# Patient Record
Sex: Male | Born: 1935 | Race: Black or African American | Hispanic: No | Marital: Married | Smoking: Never smoker
Health system: Southern US, Community
[De-identification: ages and names within clinical notes are randomized; demographics above are authoritative.]

---

## 2016-11-04 ENCOUNTER — Inpatient Hospital Stay (HOSPITAL_COMMUNITY): Payer: Medicare Other | Admitting: Anesthesiology

## 2016-11-04 ENCOUNTER — Inpatient Hospital Stay (HOSPITAL_COMMUNITY)
Admission: EM | Admit: 2016-11-04 | Discharge: 2016-11-09 | DRG: 026 | Disposition: A | Discharge status: Hospice | Payer: Medicare Other | Attending: Internal Medicine | Admitting: Internal Medicine

## 2016-11-04 ENCOUNTER — Encounter (HOSPITAL_COMMUNITY)
Admission: EM | Disposition: A | Discharge status: Hospice | Payer: Self-pay | Source: Home / Self Care | Attending: Internal Medicine

## 2016-11-04 ENCOUNTER — Emergency Department (HOSPITAL_COMMUNITY): Payer: Medicare Other

## 2016-11-04 ENCOUNTER — Encounter (HOSPITAL_COMMUNITY): Payer: Self-pay

## 2016-11-04 DIAGNOSIS — S065X9A Traumatic subdural hemorrhage with loss of consciousness of unspecified duration, initial encounter: Principal | ICD-10-CM | POA: Diagnosis present

## 2016-11-04 DIAGNOSIS — N320 Bladder-neck obstruction: Secondary | ICD-10-CM | POA: Diagnosis present

## 2016-11-04 DIAGNOSIS — D631 Anemia in chronic kidney disease: Secondary | ICD-10-CM | POA: Diagnosis not present

## 2016-11-04 DIAGNOSIS — N132 Hydronephrosis with renal and ureteral calculous obstruction: Secondary | ICD-10-CM | POA: Diagnosis not present

## 2016-11-04 DIAGNOSIS — W19XXXA Unspecified fall, initial encounter: Secondary | ICD-10-CM | POA: Diagnosis present

## 2016-11-04 DIAGNOSIS — N390 Urinary tract infection, site not specified: Secondary | ICD-10-CM | POA: Diagnosis present

## 2016-11-04 DIAGNOSIS — E872 Acidosis, unspecified: Secondary | ICD-10-CM | POA: Diagnosis present

## 2016-11-04 DIAGNOSIS — I161 Hypertensive emergency: Secondary | ICD-10-CM | POA: Diagnosis not present

## 2016-11-04 DIAGNOSIS — Z8249 Family history of ischemic heart disease and other diseases of the circulatory system: Secondary | ICD-10-CM

## 2016-11-04 DIAGNOSIS — N400 Enlarged prostate without lower urinary tract symptoms: Secondary | ICD-10-CM | POA: Diagnosis present

## 2016-11-04 DIAGNOSIS — E440 Moderate protein-calorie malnutrition: Secondary | ICD-10-CM | POA: Diagnosis present

## 2016-11-04 DIAGNOSIS — D6489 Other specified anemias: Secondary | ICD-10-CM | POA: Diagnosis present

## 2016-11-04 DIAGNOSIS — N210 Calculus in bladder: Secondary | ICD-10-CM | POA: Diagnosis present

## 2016-11-04 DIAGNOSIS — N179 Acute kidney failure, unspecified: Secondary | ICD-10-CM | POA: Diagnosis present

## 2016-11-04 DIAGNOSIS — Z6821 Body mass index (BMI) 21.0-21.9, adult: Secondary | ICD-10-CM

## 2016-11-04 DIAGNOSIS — N184 Chronic kidney disease, stage 4 (severe): Secondary | ICD-10-CM | POA: Diagnosis present

## 2016-11-04 DIAGNOSIS — Y92009 Unspecified place in unspecified non-institutional (private) residence as the place of occurrence of the external cause: Secondary | ICD-10-CM | POA: Diagnosis not present

## 2016-11-04 DIAGNOSIS — I6200 Nontraumatic subdural hemorrhage, unspecified: Secondary | ICD-10-CM

## 2016-11-04 DIAGNOSIS — R410 Disorientation, unspecified: Secondary | ICD-10-CM | POA: Diagnosis not present

## 2016-11-04 DIAGNOSIS — J9690 Respiratory failure, unspecified, unspecified whether with hypoxia or hypercapnia: Secondary | ICD-10-CM

## 2016-11-04 DIAGNOSIS — N1330 Unspecified hydronephrosis: Secondary | ICD-10-CM | POA: Diagnosis present

## 2016-11-04 DIAGNOSIS — N189 Chronic kidney disease, unspecified: Secondary | ICD-10-CM

## 2016-11-04 DIAGNOSIS — I129 Hypertensive chronic kidney disease with stage 1 through stage 4 chronic kidney disease, or unspecified chronic kidney disease: Secondary | ICD-10-CM | POA: Diagnosis not present

## 2016-11-04 DIAGNOSIS — S065XAA Traumatic subdural hemorrhage with loss of consciousness status unknown, initial encounter: Secondary | ICD-10-CM | POA: Diagnosis present

## 2016-11-04 DIAGNOSIS — R1900 Intra-abdominal and pelvic swelling, mass and lump, unspecified site: Secondary | ICD-10-CM

## 2016-11-04 DIAGNOSIS — Z9889 Other specified postprocedural states: Secondary | ICD-10-CM

## 2016-11-04 HISTORY — PX: CRANIOTOMY: SHX93

## 2016-11-04 LAB — CBC WITH DIFFERENTIAL/PLATELET
Basophils Absolute: 0 10*3/uL (ref 0.0–0.1)
Basophils Relative: 0 %
Eosinophils Absolute: 0 10*3/uL (ref 0.0–0.7)
Eosinophils Relative: 0 %
HEMATOCRIT: 27.6 % — AB (ref 39.0–52.0)
HEMOGLOBIN: 9 g/dL — AB (ref 13.0–17.0)
LYMPHS ABS: 0.8 10*3/uL (ref 0.7–4.0)
LYMPHS PCT: 11 %
MCH: 29.2 pg (ref 26.0–34.0)
MCHC: 32.6 g/dL (ref 30.0–36.0)
MCV: 89.6 fL (ref 78.0–100.0)
Monocytes Absolute: 0.4 10*3/uL (ref 0.1–1.0)
Monocytes Relative: 5 %
NEUTROS PCT: 84 %
Neutro Abs: 6.3 10*3/uL (ref 1.7–7.7)
Platelets: 276 10*3/uL (ref 150–400)
RBC: 3.08 MIL/uL — AB (ref 4.22–5.81)
RDW: 14.1 % (ref 11.5–15.5)
WBC: 7.6 10*3/uL (ref 4.0–10.5)

## 2016-11-04 LAB — PROTIME-INR
INR: 1.19
Prothrombin Time: 15.2 seconds (ref 11.4–15.2)

## 2016-11-04 LAB — URINALYSIS, ROUTINE W REFLEX MICROSCOPIC
Bilirubin Urine: NEGATIVE
GLUCOSE, UA: NEGATIVE mg/dL
KETONES UR: NEGATIVE mg/dL
LEUKOCYTES UA: NEGATIVE
Nitrite: NEGATIVE
PROTEIN: 100 mg/dL — AB
Specific Gravity, Urine: 1.011 (ref 1.005–1.030)
pH: 7 (ref 5.0–8.0)

## 2016-11-04 LAB — URINE MICROSCOPIC-ADD ON

## 2016-11-04 LAB — COMPREHENSIVE METABOLIC PANEL
ALT: 12 U/L — ABNORMAL LOW (ref 17–63)
AST: 21 U/L (ref 15–41)
Albumin: 4.1 g/dL (ref 3.5–5.0)
Alkaline Phosphatase: 54 U/L (ref 38–126)
Anion gap: 11 (ref 5–15)
BILIRUBIN TOTAL: 0.6 mg/dL (ref 0.3–1.2)
BUN: 31 mg/dL — AB (ref 6–20)
CO2: 20 mmol/L — ABNORMAL LOW (ref 22–32)
Calcium: 9.1 mg/dL (ref 8.9–10.3)
Chloride: 110 mmol/L (ref 101–111)
Creatinine, Ser: 3.51 mg/dL — ABNORMAL HIGH (ref 0.61–1.24)
GFR, EST AFRICAN AMERICAN: 18 mL/min — AB (ref >60)
GFR, EST NON AFRICAN AMERICAN: 15 mL/min — AB (ref >60)
Glucose, Bld: 106 mg/dL — ABNORMAL HIGH (ref 65–99)
POTASSIUM: 4.4 mmol/L (ref 3.5–5.1)
Sodium: 141 mmol/L (ref 135–145)
TOTAL PROTEIN: 7.4 g/dL (ref 6.5–8.1)

## 2016-11-04 LAB — RAPID URINE DRUG SCREEN, HOSP PERFORMED
Amphetamines: NOT DETECTED
Barbiturates: NOT DETECTED
Benzodiazepines: NOT DETECTED
Cocaine: NOT DETECTED
OPIATES: NOT DETECTED
Tetrahydrocannabinol: NOT DETECTED

## 2016-11-04 LAB — APTT: aPTT: 27 seconds (ref 24–36)

## 2016-11-04 LAB — ETHANOL

## 2016-11-04 SURGERY — CRANIOTOMY HEMATOMA EVACUATION SUBDURAL
Anesthesia: General | Site: Head | Laterality: Left

## 2016-11-04 MED ORDER — NICARDIPINE HCL IN NACL 20-0.86 MG/200ML-% IV SOLN
3.0000 mg/h | Freq: Once | INTRAVENOUS | Status: DC
  Filled 2016-11-04: qty 200

## 2016-11-04 MED ORDER — FENTANYL CITRATE (PF) 100 MCG/2ML IJ SOLN
INTRAMUSCULAR | Status: DC | PRN
  Administered 2016-11-04: 200 ug via INTRAVENOUS

## 2016-11-04 MED ORDER — CEFTRIAXONE SODIUM 2 G IJ SOLR
2.0000 g | INTRAMUSCULAR | Status: DC
  Administered 2016-11-05 – 2016-11-08 (×4): 2 g via INTRAVENOUS
  Filled 2016-11-04 (×5): qty 2

## 2016-11-04 MED ORDER — SODIUM CHLORIDE 0.9 % IV SOLN
INTRAVENOUS | Status: DC
  Administered 2016-11-05 – 2016-11-06 (×3): via INTRAVENOUS

## 2016-11-04 MED ORDER — SUCCINYLCHOLINE CHLORIDE 20 MG/ML IJ SOLN
INTRAMUSCULAR | Status: DC | PRN
  Administered 2016-11-04: 140 mg via INTRAVENOUS

## 2016-11-04 MED ORDER — SODIUM CHLORIDE 0.9 % IV BOLUS (SEPSIS)
1000.0000 mL | Freq: Once | INTRAVENOUS | Status: AC
  Administered 2016-11-04: 1000 mL via INTRAVENOUS

## 2016-11-04 MED ORDER — FENTANYL CITRATE (PF) 100 MCG/2ML IJ SOLN
INTRAMUSCULAR | Status: AC
  Filled 2016-11-04: qty 2

## 2016-11-04 MED ORDER — PROPOFOL 10 MG/ML IV BOLUS
INTRAVENOUS | Status: DC | PRN
  Administered 2016-11-04: 100 mg via INTRAVENOUS
  Administered 2016-11-04: 25 mg via INTRAVENOUS

## 2016-11-04 MED ORDER — SUGAMMADEX SODIUM 200 MG/2ML IV SOLN
INTRAVENOUS | Status: AC
  Filled 2016-11-04: qty 2

## 2016-11-04 MED ORDER — CEFAZOLIN SODIUM-DEXTROSE 2-3 GM-% IV SOLR
INTRAVENOUS | Status: DC | PRN
  Administered 2016-11-04: 2 g via INTRAVENOUS

## 2016-11-04 MED ORDER — THROMBIN 20000 UNITS EX SOLR
CUTANEOUS | Status: AC
  Filled 2016-11-04: qty 20000

## 2016-11-04 MED ORDER — THROMBIN 20000 UNITS EX SOLR
CUTANEOUS | Status: DC | PRN
  Administered 2016-11-04: 20 mL via TOPICAL

## 2016-11-04 MED ORDER — NICARDIPINE HCL IN NACL 20-0.86 MG/200ML-% IV SOLN
3.0000 mg/h | Freq: Once | INTRAVENOUS | Status: AC
  Administered 2016-11-04: 5 mg/h via INTRAVENOUS
  Filled 2016-11-04: qty 200

## 2016-11-04 MED ORDER — LIDOCAINE HCL (PF) 0.5 % IJ SOLN
INTRAMUSCULAR | Status: AC
  Filled 2016-11-04: qty 50

## 2016-11-04 MED ORDER — ROCURONIUM BROMIDE 100 MG/10ML IV SOLN
INTRAVENOUS | Status: DC | PRN
  Administered 2016-11-04: 40 mg via INTRAVENOUS

## 2016-11-04 MED ORDER — ONDANSETRON HCL 4 MG/2ML IJ SOLN
INTRAMUSCULAR | Status: AC
  Filled 2016-11-04: qty 2

## 2016-11-04 MED ORDER — PHENYLEPHRINE HCL 10 MG/ML IJ SOLN
INTRAMUSCULAR | Status: DC | PRN
  Administered 2016-11-04: 25 ug/min via INTRAVENOUS

## 2016-11-04 MED ORDER — SODIUM CHLORIDE 0.9 % IV SOLN: 250.0000 mL | INTRAVENOUS | Status: DC | PRN

## 2016-11-04 MED ORDER — 0.9 % SODIUM CHLORIDE (POUR BTL) OPTIME
TOPICAL | Status: DC | PRN
  Administered 2016-11-04 (×2): 1000 mL

## 2016-11-04 MED ORDER — LIDOCAINE-EPINEPHRINE 0.5 %-1:200000 IJ SOLN
INTRAMUSCULAR | Status: DC | PRN
  Administered 2016-11-04: 6 mL via INTRADERMAL

## 2016-11-04 MED ORDER — SODIUM CHLORIDE 0.9 % IV SOLN
INTRAVENOUS | Status: DC | PRN
  Administered 2016-11-04 – 2016-11-05 (×2): via INTRAVENOUS

## 2016-11-04 MED ORDER — FAMOTIDINE IN NACL 20-0.9 MG/50ML-% IV SOLN: 20.0000 mg | Freq: Two times a day (BID) | INTRAVENOUS | Status: DC

## 2016-11-04 MED ORDER — PROPOFOL 10 MG/ML IV BOLUS
INTRAVENOUS | Status: AC
  Filled 2016-11-04: qty 20

## 2016-11-04 MED ORDER — LIDOCAINE HCL (CARDIAC) 20 MG/ML IV SOLN
INTRAVENOUS | Status: DC | PRN
  Administered 2016-11-04: 100 mg via INTRATRACHEAL

## 2016-11-04 MED ORDER — MICROFIBRILLAR COLL HEMOSTAT EX PADS
MEDICATED_PAD | CUTANEOUS | Status: DC | PRN
  Administered 2016-11-04: 1 via TOPICAL

## 2016-11-04 SURGICAL SUPPLY — 71 items
BANDAGE GAUZE 4  KLING STR (GAUZE/BANDAGES/DRESSINGS) ×3 IMPLANT
BENZOIN TINCTURE PRP APPL 2/3 (GAUZE/BANDAGES/DRESSINGS) IMPLANT
BLADE CLIPPER SURG (BLADE) ×3 IMPLANT
BLADE ULTRA TIP 2M (BLADE) ×3 IMPLANT
BNDG GAUZE ELAST 4 BULKY (GAUZE/BANDAGES/DRESSINGS) ×6 IMPLANT
BUR ACORN 6.0 PRECISION (BURR) ×2 IMPLANT
BUR ACORN 6.0MM PRECISION (BURR) ×1
BUR MATCHSTICK NEURO 3.0 LAGG (BURR) IMPLANT
BUR SPIRAL ROUTER 2.3 (BUR) IMPLANT
BUR SPIRAL ROUTER 2.3MM (BUR)
CANISTER SUCT 3000ML PPV (MISCELLANEOUS) ×3 IMPLANT
CLIP TI MEDIUM 6 (CLIP) IMPLANT
DRAPE NEUROLOGICAL W/INCISE (DRAPES) ×3 IMPLANT
DRAPE SURG 17X23 STRL (DRAPES) IMPLANT
DRAPE WARM FLUID 44X44 (DRAPE) ×3 IMPLANT
DURAPREP 6ML APPLICATOR 50/CS (WOUND CARE) ×3 IMPLANT
ELECT CAUTERY BLADE 6.4 (BLADE) ×3 IMPLANT
ELECT REM PT RETURN 9FT ADLT (ELECTROSURGICAL) ×3
ELECTRODE REM PT RTRN 9FT ADLT (ELECTROSURGICAL) ×1 IMPLANT
EVACUATOR 1/8 PVC DRAIN (DRAIN) IMPLANT
EVACUATOR SILICONE 100CC (DRAIN) IMPLANT
GAUZE SPONGE 4X4 12PLY STRL (GAUZE/BANDAGES/DRESSINGS) ×3 IMPLANT
GAUZE SPONGE 4X4 16PLY XRAY LF (GAUZE/BANDAGES/DRESSINGS) IMPLANT
GLOVE BIO SURGEON STRL SZ 6.5 (GLOVE) ×2 IMPLANT
GLOVE BIO SURGEON STRL SZ7 (GLOVE) ×3 IMPLANT
GLOVE BIO SURGEONS STRL SZ 6.5 (GLOVE) ×1
GLOVE BIOGEL PI IND STRL 6.5 (GLOVE) ×1 IMPLANT
GLOVE BIOGEL PI IND STRL 7.5 (GLOVE) ×1 IMPLANT
GLOVE BIOGEL PI INDICATOR 6.5 (GLOVE) ×2
GLOVE BIOGEL PI INDICATOR 7.5 (GLOVE) ×2
GLOVE ECLIPSE 6.5 STRL STRAW (GLOVE) ×3 IMPLANT
GLOVE EXAM NITRILE LRG STRL (GLOVE) IMPLANT
GLOVE EXAM NITRILE XL STR (GLOVE) IMPLANT
GLOVE EXAM NITRILE XS STR PU (GLOVE) IMPLANT
GOWN STRL REUS W/ TWL LRG LVL3 (GOWN DISPOSABLE) ×3 IMPLANT
GOWN STRL REUS W/ TWL XL LVL3 (GOWN DISPOSABLE) IMPLANT
GOWN STRL REUS W/TWL 2XL LVL3 (GOWN DISPOSABLE) IMPLANT
GOWN STRL REUS W/TWL LRG LVL3 (GOWN DISPOSABLE) ×6
GOWN STRL REUS W/TWL XL LVL3 (GOWN DISPOSABLE)
GRAFT DURAGEN MATRIX 1WX1L (Tissue) ×3 IMPLANT
HEMOSTAT SURGICEL 2X14 (HEMOSTASIS) IMPLANT
KIT BASIN OR (CUSTOM PROCEDURE TRAY) ×3 IMPLANT
KIT ROOM TURNOVER OR (KITS) ×3 IMPLANT
NEEDLE HYPO 25X1 1.5 SAFETY (NEEDLE) ×3 IMPLANT
NS IRRIG 1000ML POUR BTL (IV SOLUTION) ×3 IMPLANT
PACK CRANIOTOMY (CUSTOM PROCEDURE TRAY) ×3 IMPLANT
PATTIES SURGICAL .5 X.5 (GAUZE/BANDAGES/DRESSINGS) IMPLANT
PATTIES SURGICAL .5 X3 (DISPOSABLE) IMPLANT
PATTIES SURGICAL 1X1 (DISPOSABLE) IMPLANT
PLATE 1.5  2HOLE MED NEURO (Plate) ×4 IMPLANT
PLATE 1.5 2HOLE MED NEURO (Plate) ×2 IMPLANT
PLATE 1.5 5HOLE SQUARE (Plate) ×9 IMPLANT
SCREW SELF DRILL HT 1.5/4MM (Screw) ×42 IMPLANT
SPONGE NEURO XRAY DETECT 1X3 (DISPOSABLE) IMPLANT
SPONGE SURGIFOAM ABS GEL 100 (HEMOSTASIS) ×3 IMPLANT
STAPLER VISISTAT 35W (STAPLE) ×3 IMPLANT
SUT ETHILON 3 0 FSL (SUTURE) IMPLANT
SUT ETHILON 3 0 PS 1 (SUTURE) IMPLANT
SUT NURALON 4 0 TR CR/8 (SUTURE) ×9 IMPLANT
SUT STEEL 0 (SUTURE)
SUT STEEL 0 18XMFL TIE 17 (SUTURE) IMPLANT
SUT VIC AB 2-0 CT2 18 VCP726D (SUTURE) ×6 IMPLANT
SYR CONTROL 10ML LL (SYRINGE) ×3 IMPLANT
TAPE CLOTH 1X10 TAN NS (GAUZE/BANDAGES/DRESSINGS) ×3 IMPLANT
TOWEL OR 17X24 6PK STRL BLUE (TOWEL DISPOSABLE) ×3 IMPLANT
TOWEL OR 17X26 10 PK STRL BLUE (TOWEL DISPOSABLE) ×3 IMPLANT
TRAY FOLEY W/METER SILVER 16FR (SET/KITS/TRAYS/PACK) ×3 IMPLANT
TUBE CONNECTING 12'X1/4 (SUCTIONS) ×1
TUBE CONNECTING 12X1/4 (SUCTIONS) ×2 IMPLANT
UNDERPAD 30X30 (UNDERPADS AND DIAPERS) ×3 IMPLANT
WATER STERILE IRR 1000ML POUR (IV SOLUTION) ×3 IMPLANT

## 2016-11-04 NOTE — ED Notes (Signed)
Patient transported to X-ray 

## 2016-11-04 NOTE — ED Provider Notes (Signed)
[facility] Provider Note   CSN: [id number] Arrival date & time: 11/04/16  1420     History   Chief Complaint Chief Complaint  Patient presents with  . Altered Mental Status    level V caveat due to altered mental status. HPI Jeremy CanavanBobby Greenhouse is a 80 y.o. male.  HPI Patient presents with altered mental status. Initial report was that he has baseline confusion but after further discussion the family he is normally appropriate. Now he is somewhat confused. Cannot tell if the date. Can't tell him the names. Cannot tell what exactly was going on or how he ended up in the ER.denies headache. Denies abdominal pain. Reportedly has had a couple weeks ago. Patient states he is retired. History reviewed. No pertinent past medical history.  There are no active problems to display for this patient.   History reviewed. No pertinent surgical history.     Home Medications    Prior to Admission medications   Medication Sig Start Date End Date Taking? Authorizing Provider  Ascorbic Acid (VITAMIN C PO) Take 1 tablet by mouth daily.   Yes Historical Provider, MD  Multiple Vitamins-Minerals (ONE-A-DAY MENS 50+ ADVANTAGE PO) Take 1 tablet by mouth daily.   Yes Historical Provider, MD  POTASSIUM PO Take 1 tablet by mouth daily.   Yes Historical Provider, MD  Pyridoxine HCl (VITAMIN B-6 PO) Take 1 tablet by mouth daily.   Yes Historical Provider, MD    Family History History reviewed. No pertinent family history.  Social History Social History  Substance Use Topics  . Smoking status: Never Smoker  . Smokeless tobacco: Never Used  . Alcohol use No     Allergies   Patient has no known allergies.   Review of Systems Review of Systems  Unable to perform ROS: Mental status change     Physical Exam Updated Vital Signs BP 181/94 (BP Location: Right Arm)   Pulse 61   Temp 97.9 F (36.6 C) (Rectal)   Resp 16   Physical Exam  Constitutional: He appears well-developed.    HENT:  Head: Normocephalic.  Eyes: Pupils are equal, round, and reactive to light. No scleral icterus.  Neck: Neck supple.  Cardiovascular: Normal rate.   Pulmonary/Chest: Effort normal.  Abdominal: He exhibits mass.  Moderate size abdominal mass. Seems to be ordered originating from the left lower quadrant and proceeds up past the umbilicus. No hernias palpated. Per family members this is been there recently and was not chronic.  Musculoskeletal: He exhibits no edema.  Neurological: He is alert.  Patient is awake and oriented to self but somewhat confused otherwise.  Skin: There is pallor.  Psychiatric: He has a normal mood and affect.     ED Treatments / Results  Labs (all labs ordered are listed, but only abnormal results are displayed) Labs Reviewed  COMPREHENSIVE METABOLIC PANEL  ETHANOL  URINALYSIS, ROUTINE W REFLEX MICROSCOPIC (NOT [facility])  RAPID URINE DRUG SCREEN, HOSP PERFORMED  CBC WITH DIFFERENTIAL/PLATELET    EKG  EKG Interpretation None       Radiology No results found.  Procedures Procedures (including critical care time)  Medications Ordered in ED Medications - No data to display   Initial Impression / Assessment and Plan / ED Course  I have reviewed the triage vital signs and the nursing notes.  Pertinent labs & imaging results that were available during my care of the patient were reviewed by me and considered in my medical decision making (see chart  for details).  Clinical Course      Reportedly has had it for the last 2-3 days. Labs pending. Will get head CT abdominal pelvis CT for mass and chest x-ray. Will also get workup for infection Care turned over to Dr. Erma HeritageIsaacs. Family is worried he is normally  Final Clinical Impressions(s) / ED Diagnoses   Final diagnoses:  None    New Prescriptions New Prescriptions   No medications on file     Benjiman CoreNathan Amillion [place], MD 11/04/16 1516

## 2016-11-04 NOTE — Anesthesia Preprocedure Evaluation (Addendum)
Anesthesia Evaluation  Patient identified by MRN, date of birth, ID bandGeneral Assessment Comment:Patient somnolent   Airway Mallampati: II       Dental  (+) Dental Advisory Given,    Pulmonary    Pulmonary exam normal        Cardiovascular Normal cardiovascular exam     Neuro/Psych    GI/Hepatic   Endo/Other    Renal/GU      Musculoskeletal   Abdominal   Peds  Hematology   Anesthesia Other Findings   Reproductive/Obstetrics                            Anesthesia Physical Anesthesia Plan  ASA: III and emergent  Anesthesia Plan:    Post-op Pain Management:    Induction: Intravenous, Rapid sequence and Cricoid pressure planned  Airway Management Planned: Oral ETT  Additional Equipment: Arterial line  Intra-op Plan:   Post-operative Plan: Possible Post-op intubation/ventilation  Informed Consent:   Plan Discussed with: CRNA, Anesthesiologist and Surgeon  Anesthesia Plan Comments:         Anesthesia Quick Evaluation

## 2016-11-04 NOTE — H&P (Signed)
PULMONARY / CRITICAL CARE MEDICINE   Name: Jeremy Roach MRN: [id number] DOB: 07/26/1936    ADMISSION DATE:  11/04/2016 CONSULTATION DATE:  11/04/2016  REFERRING MD:  Dr. Franky Machoabbell  CHIEF COMPLAINT:  Lethargy  HISTORY OF PRESENT ILLNESS:   80 year old male with no significant past medical history, albeit, he has not seen a doctor for several years. His wife is a Engineer, civil (consulting)nurse and regularly checks his BP and CBG which are usually fine. He fell at home about 2 weeks PTA and hit his head. He did tell his daughter about this, but not his wife. 11/7 he noticed some fatigue, was otherwise OK. The progressively worsened until 11/9 when he was lethargic. He has also had urinary frequency, but no dysuria, weak stream, or any other complaint. Wife called EMS. 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 Neurosurgery planning for OR. Also noted to have abdominal mass on exam. Sent for CT abdomen/pelvis which demonstrated bilateral hydroureteronephrosis and bladder distention. Bladder calcification noted possibly at the origin of the urethra. PCCM asked to admit.  Wife notes unintentional weight loss of 30 pounds over the last year.  PAST MEDICAL HISTORY :  He  has no past medical history on file. Has not seen a doctor.   PAST SURGICAL HISTORY: He  has no past surgical history on file.  Nasal surgery > ? 20 yrs ago, related to trauma.   No Known Allergies  No current facility-administered medications on file prior to encounter.    No current outpatient prescriptions on file prior to encounter.    FAMILY HISTORY:  His has no family status information on file.  No information on his father. Mother is deceased and she had heart dse.   SOCIAL HISTORY: He  reports that he has never smoked. He has never used smokeless tobacco. He reports that he does not drink alcohol or use drugs.  He is married, has 4 children, was  from [place] originally, has been in [place] < 20 yrs now.  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   REVIEW OF SYSTEMS:   Limited due to lethargy and encephalopathy Bolds are positive  Constitutional: weight loss, gain, night sweats, Fevers, chills, fatigue .  HEENT: headaches, Sore throat, sneezing, nasal congestion, post nasal drip, Difficulty swallowing, Tooth/dental problems, visual complaints visual changes, ear ache CV:  chest pain, radiates: ,Orthopnea, PND, swelling in lower extremities, dizziness, palpitations, syncope.  GI  heartburn, indigestion, abdominal pain, nausea, vomiting, diarrhea, change in bowel habits, loss of appetite, bloody stools.  Resp: cough, productive: , hemoptysis, dyspnea, chest pain, pleuritic.  Skin: rash or itching or icterus GU: dysuria, change in color of urine, urgency or frequency. flank pain, hematuria  MS: joint pain or swelling. decreased range of motion  Psych: change in mood or affect. depression or anxiety.  Neuro: difficulty with speech, weakness, numbness, ataxia    SUBJECTIVE:  Comfortable. Lethargic.   VITAL SIGNS: BP 195/88   Pulse (!) 58   Temp 97.9 F (36.6 C) (Rectal)   Resp 14   SpO2 100%   HEMODYNAMICS:    VENTILATOR SETTINGS:    INTAKE / OUTPUT: I/O last 3 completed shifts: In: -  Out: 400 [Urine:400]  PHYSICAL EXAMINATION: General:  Elderly male, thin, NAD Neuro:  Alert to voice. Oriented to self.  HEENT:  Normocephalic, small old abrasion to L side temporal face. Cardiovascular:  RRR, no MRG Lungs:  Clear, bilateral  breath sounds Abdomen:  BS normal, soft, non-tender. Musculoskeletal:  No acute deformity or ROM limitation Skin:  Grossly intact  LABS:  BMET  Recent Labs Lab 11/04/16 1600  NA 141  K 4.4  CL 110  CO2 20*  BUN 31*  CREATININE 3.51*  GLUCOSE 106*    Electrolytes  Recent Labs Lab 11/04/16 1600  CALCIUM 9.1    CBC  Recent Labs Lab 11/04/16 1600  WBC 7.6  HGB 9.0*  HCT 27.6*  PLT 276     Coag's  Recent Labs Lab 11/04/16 1945  APTT 27  INR 1.19    Sepsis Markers No results for input(s): LATICACIDVEN, PROCALCITON, O2SATVEN in the last 168 hours.  ABG No results for input(s): PHART, PCO2ART, PO2ART in the last 168 hours.  Liver Enzymes  Recent Labs Lab 11/04/16 1600  AST 21  ALT 12*  ALKPHOS 54  BILITOT 0.6  ALBUMIN 4.1    Cardiac Enzymes No results for input(s): TROPONINI, PROBNP in the last 168 hours.  Glucose No results for input(s): GLUCAP in the last 168 hours.  Imaging Ct Abdomen Pelvis Wo Contrast  Result Date: 11/04/2016 CLINICAL DATA:  Pain after fall. Abdominal mass. Altered mental status. EXAM: CT ABDOMEN AND PELVIS WITHOUT CONTRAST TECHNIQUE: Multidetector CT imaging of the abdomen and pelvis was performed following the standard protocol without IV contrast. COMPARISON:  None. FINDINGS: Lower chest: The heart is enlarged. Hyperdense appearance of the cardiac walls and septum may be secondary to anemia. Faint calcifications noted in the left ventricle, possibly related to the mitral valve. No pericardial effusion. There is streaky atelectasis and/or scarring at each lung base left greater than right. No pneumonic consolidations. Tiny nodular density in the right lower lobe laterally consistent with a branch point for pulmonary vessel. Hepatobiliary: Nonspecific hypodensities in the left hepatic lobe the largest is 15 mm seen on series 201, image 13 with Hounsfield unit of 14. Findings are statistically consistent with cysts and/or hemangiomata. The lack of IV contrast limits further assessment. No biliary dilatation is noted. Gallbladder appears physiologically distended without calculus. Pancreas: The pancreas is unremarkable for this unenhanced study. No ductal dilatation is apparent. Spleen: There is no splenomegaly. Adrenals/Urinary Tract: Marked bilateral hydroureteronephrosis with marked distention of the bladder to 22.3 cm craniocaudad by 13  cm AP by 15.2 cm transverse. There is a calcification along the floor of the bladder measuring 12 by 8 by 5 mm possibly at the origin of the prostatic urethra. This may be causing obstruction. Foley catheter decompression is recommended. Neither adrenal gland is well visualized due to lack of oral and IV contrast. Stomach/Bowel: No bowel obstruction or definite inflammation. Stomach is not distended. Moderate amount of stool in the right colon and rectum. Vascular/Lymphatic: Aortoiliac atherosclerosis without aneurysm. No lymphadenopathy. Numerous pelvic phleboliths are seen bilaterally in the lower pelvis. Reproductive: Enlarged prostate with peripheral and central zone calcifications measuring up to 7 cm. Other: No bowel herniation. Trace fluid along the paracolic gutters. Musculoskeletal: No acute osseous abnormality. There is degenerative disc disease at L3-4, L4-5 and L5-S1 with associated mild-to-moderate neural foraminal encroachment at L4-5. IMPRESSION: Marked bilateral hydroureteronephrosis and bladder distention. A 12 x 8 x 5 mm bladder calcification is seen possibly at the origin of the prostatic urethra which may explain the marked bladder (volume of 4.4 liters) O and renal collecting system obstruction described. Foley catheter to drainage is recommended. Electronically Signed   By: Tollie Eth M.D.   On: 11/04/2016 18:36   Dg  Chest 2 View  Result Date: 11/04/2016 CLINICAL DATA:  Altered mental status EXAM: CHEST  2 VIEW COMPARISON:  None. FINDINGS: There is no focal parenchymal opacity. There is no pleural effusion or pneumothorax. There is stable cardiomegaly. The osseous structures are unremarkable. IMPRESSION: No active cardiopulmonary disease. Electronically Signed   By: Elige Ko   On: 11/04/2016 15:19   Ct Head Wo Contrast  Result Date: 11/04/2016 CLINICAL DATA:  Altered mental status with history of fall EXAM: CT HEAD WITHOUT CONTRAST TECHNIQUE: Contiguous axial images were obtained  from the base of the skull through the vertex without intravenous contrast. COMPARISON:  None. 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 This is larger on the left side, and measures at least 2.5 cm in thickness. There is approximately 12 mm of midline shift to the right. Right-sided subdural hematoma measures 1.2 cm in maximum thickness along the right parietal convexity on coronal views. There is inter hemispheric extension of subdural blood anteriorly and posteriorly, right inter hemispheric subdural hematoma measures 8 mm in thickness. There is additional subdural blood along the right tentorium. There is compression and mass effect upon the lateral ventricles with asymmetric dilatation of the right atrium. Fourth ventricle is small but patent. There is mild effacement of the pre pontine and suprasellar cisterns. Diffuse bilateral brain swelling. There is no transtentorial herniation. Sagittal views demonstrate a possible 4 mm hypodense nodule within the posterior aspect of the pituitary gland. There is no focal mass. Vascular: No hyperdense vessels.  Carotid artery calcifications. Skull: No obvious fracture.  The mastoid air cells are clear. Sinuses/Orbits: Minimal mucosal thickening in the ethmoid sinuses. The globes appear intact. Other: None IMPRESSION: Large bilateral left greater than right acute on chronic subdural hematoma as with approximately 12 mm of midline shift to the right. There is inter hemispheric subdural hematoma as well as right tentorial subdural. There is significant mass effect on the lateral ventricles. Fourth ventricle is patent. There is mild effacement of the basilar cisterns with diffuse brain swelling present. Possible 4 mm hypodense nodule in the posterior aspect of the pituitary gland. Eventual MRI evaluation may be considered Critical Value/emergent results were called by telephone at the time of interpretation on 11/04/2016 at 6:37 pm  to Dr. Erma Heritage , who verbally acknowledged these results. Electronically Signed   By: Jasmine Pang M.D.   On: 11/04/2016 18:37     STUDIES:  CT head 11/9 > Large bilateral left greater than right acute on chronic subdural hematoma as with approximately 12 mm of midline shift to the right. There is inter hemispheric subdural hematoma as well as right tentorial subdural. There is significant mass effect on the lateral ventricles. Fourth ventricle is patent. There is mild effacement of the basilar cisterns with diffuse brain swelling present. Possible 4 mm hypodense nodule in the posterior aspect of the pituitary gland. Eventual MRI evaluation may be considered. CT abdomen 11/9 > Marked bilateral hydroureteronephrosis and bladder distention. A 12 x 8 x 5 mm bladder calcification is seen possibly at the origin of the prostatic urethra which may explain the marked bladder (volume of 4.4 liters) O and renal collecting system obstruction described. Foley catheter to drainage is recommended.  CULTURES: Urine Cx 11/9 >  ANTIBIOTICS: Ceftriaxone 11/9 >  SIGNIFICANT EVENTS: 11/9 admit, to OR for SDH evac  LINES/TUBES:   DISCUSSION: 80 year old male admitted 11/9 with SDH after fall 2 weeks ago. Also with bladder outlet obstruction and bilateral hydro.  ASSESSMENT / PLAN:  PULMONARY A: May need mechanical vent post-op  P:   Will follow up after surgery  CARDIOVASCULAR A:  Hypertensive urgency  P:  Telemetry monitoring SBP goals 25% reduction for first 24 hours. Keep 150-170.  Nicardipine for SBP goal  RENAL A:   Presumably Acute renal failiure, post renal in setting bladder outlet obstruction Bilateral hydronephrosis  P:   Foley - draining bloody appearing urine Follow BMP Gentle hydration If does not resolve with foley drainage may need IR to place drains.  GASTROINTESTINAL A:   No acute issues  P:   NPO Pepcid  HEMATOLOGIC A:   Anemia, unknown acuity  P:   SCDs Follow CBC  INFECTIOUS A:   Empiric tx in setting hydro, urinary stasis  P:   CTX per pharmacy Follow urin cx PCT Likely can deescalate quickly  ENDOCRINE A:   No acute issues  P:     NEUROLOGIC A:   bilateral L > R subdural hematoma P:   To OR under Dr. Franky Macho tonight Will follow up post-op Neuro checks Limit sedatives.   FAMILY  - Updates: Wife updated by AD in ED  - Inter-disciplinary family meet or Palliative Care meeting due by:  11/16    Joneen Roach, AGACNP-BC [place] [facility] Pager [phone] or [phone]  11/04/2016 10:49 PM   ATTENDING NOTE / ATTESTATION NOTE :   I have discussed the case with the resident/APP  Joneen Roach.   I agree with the resident/APP's  history, physical examination, assessment, and plans.    I have edited the above note and modified it according to our agreed history, physical examination, assessment and plan.   Patient admitted for worsening confusion and lethargy. Patient does not have any past medical history as he does not see a doctor on a regular basis. He had a fall 1-2 weeks ago. Wife noticed some confusion less than a week ago which slowly worsened. He was sleepy almost all day today which prompted wife to call [phone]. Cranial CT scan showed acute on chronic subdural hematoma, bilateral, with 12 mm midline shift to the right. Neurosurgery was consulted and the plan is for him to go to surgery tonight.  Patient was also found to be hypertensive (180-200/80-90) for which she was placed on Cardene drip. Wife and patient deny any urinary problems.  CT scan of the abdomen showed marked bilateral Hydro ureteronephrosis and bladder distention. There was a 12 x 8 x 5 mm latter calcification at the origin of the prostatic urethra, likely explaining hydronephrosis. Foley catheter has since been inserted with note of hematuria.  Physical exam as above. Patient seen, drowsy, denies any subjective complaint,  comfortable. Blood pressure was 180/90, heart rate 60, respiratory rate 20, 100% O2 saturation on 2 L nasal cannula. Cranial nerves grossly intact. No lateralizing signs elicited. No neck vein distention. Chest exam revealed good air entry bilaterally. Clear to auscultation. Cardiac exam showed good S1 and S2. No S3 murmur rub or gallop. Abdomen such as possible bowel sounds. Soft. Nontender. Extremities revealed negative edema clubbing cyanosis.  Labs reviewed. Ct scan of head and abdomen as described above. Creatinine elevated at 3.5. Bicarbonate 20. BUN 31. WBC was 8. Platelet was 276. INR 1.19. No acute changes in chest x-ray.  Assessment : 1. Acute on chronic bilateral subdural hematoma. Likely related to fall. 12 mm midline shift to the Right. 2. AKI 2/2 B hydroureteronephrosis 2/2 bladder outlet obstruction 3. HTN, not sure if related  to #1 with herniation. Likely chronic HTN 4. Possible UTI  Plan : 1. Pt is at OR now for evacuation of subdural hematoma.  2. Cont cardene drip. Will defer to neurosurgery what MAP goal or SBP goal will be. I anticipate, blood pressure will drop with anesthesia and sedation. 3. Continue Foley catheter for bladder outlet obstruction. Send urine for culture. Continue Rocephin for now for possible UTI. If cultures are negative, may discontinue antibiotics. Will need urology consultation later on.  4. Anticipate, he will be on the ventilator postoperatively. Weaning will be contingent on how outcome of surgery will be. 5. Cont IVF. 6. If on the ventilator, will need sedation (Propofol) post op.  7. Keep NPO.  8. SCDs.   I have spent 30  minutes of critical care time with this patient today.  Family :Family updated at length today.  I extensively discussed the plan of care with the patient's wife.   Pollie MeyerJ. Angelo A de Dios, MD 11/04/2016, 10:49 PM [facility] Pager [phone] After 3 pm or if no answer, call  [phone]

## 2016-11-04 NOTE — ED Provider Notes (Signed)
Assumed care from Dr. Rubin PayorPickering at 3 PM. Briefly, the patient is a 80 year old male with no significant past medical history here with acute altered mental status. Patient normally alert and oriented at baseline. Patient noted to have abdominal mass on exam. Nonfocal neurological exam. Plan of follow-up labs, imaging, and reassess..   Labs Reviewed  COMPREHENSIVE METABOLIC PANEL - Abnormal; Notable for the following:       Result Value   CO2 20 (*)    Glucose, Bld 106 (*)    BUN 31 (*)    Creatinine, Ser 3.51 (*)    ALT 12 (*)    GFR calc non Af Amer 15 (*)    GFR calc Af Amer 18 (*)    All other components within normal limits  URINALYSIS, ROUTINE W REFLEX MICROSCOPIC (NOT [facility]) - Abnormal; Notable for the following:    Color, Urine RED (*)    APPearance TURBID (*)    Hgb urine dipstick LARGE (*)    Protein, ur 100 (*)    All other components within normal limits  CBC WITH DIFFERENTIAL/PLATELET - Abnormal; Notable for the following:    RBC 3.08 (*)    Hemoglobin 9.0 (*)    HCT 27.6 (*)    All other components within normal limits  URINE MICROSCOPIC-ADD ON - Abnormal; Notable for the following:    Squamous Epithelial / LPF 0-5 (*)    Bacteria, UA RARE (*)    All other components within normal limits  ETHANOL  RAPID URINE DRUG SCREEN, HOSP PERFORMED  PROTIME-INR  APTT  CBC  BASIC METABOLIC PANEL  MAGNESIUM  PHOSPHORUS    Course of Care: -Labs and imaging reviewed as above. CT head shows large, acute on chronic subdurals bilaterally, with midline shift. I immediately discussed with neurosurgery, who will take the patient to the OR for drainage. Otherwise, CT abdomen pelvis shows large, markedly distended bladder with bilateral hydronephrosis and lab work shows acute kidney injury. Suspect postobstructive AK I, possibly secondary to retained stone, versus prostatomegaly, versus possible prostate cancer. Foley placed with drainage of large amount of urine. Will admit to the ICU  following operative drainage.  CRITICAL CARE Performed by: Dollene Clevelandameron Isascs Total critical care time: 45 minutes  Critical care time was exclusive of separately billable procedures and treating other patients.  Critical care was necessary to treat or prevent imminent or life-threatening deterioration.  Critical care was time spent personally by me on the following activities: development of treatment plan with patient and/or surrogate as well as nursing, discussions with consultants, evaluation of patient's response to treatment, examination of patient, obtaining history from patient or surrogate, ordering and performing treatments and interventions, ordering and review of laboratory studies, ordering and review of radiographic studies, pulse oximetry and re-evaluation of patient's condition.      Shaune Pollackameron Kiyo Heal, MD 11/05/16 [phone]

## 2016-11-04 NOTE — Anesthesia Procedure Notes (Signed)
Procedure Name: Intubation Date/Time: 11/04/2016 10:40 PM Performed by: Molli HazardGORDON, Lilyian Quayle M Pre-anesthesia Checklist: Patient identified, Emergency Drugs available, Suction available and Patient being monitored Patient Re-evaluated:Patient Re-evaluated prior to inductionPreoxygenation: Pre-oxygenation with 100% oxygen Intubation Type: IV induction, Rapid sequence and Cricoid Pressure applied Laryngoscope Size: Miller and 2 Grade View: Grade II Tube type: Subglottic suction tube Tube size: 7.5 mm Number of attempts: 1 Airway Equipment and Method: Stylet Placement Confirmation: ETT inserted through vocal cords under direct vision,  positive ETCO2 and breath sounds checked- equal and bilateral Secured at: 23 cm Tube secured with: Tape Dental Injury: Teeth and Oropharynx as per pre-operative assessment

## 2016-11-04 NOTE — Consult Note (Signed)
Reason for Consult:Subdural hematoma, altered mental status Referring Physician: ED  Jeremy Roach is an 80 y.o. male.  HPI: whom was in his usual state of health until this past week. He became weak, was not acting normally, was confused, not oriented to place, time, situation. Head CT revealed a large left sided mixed density subdural hematoma causing mass effect.   History reviewed. No pertinent past medical history.  History reviewed. No pertinent surgical history.  History reviewed. No pertinent family history.  Social History:  reports that he has never smoked. He has never used smokeless tobacco. He reports that he does not drink alcohol or use drugs.  Allergies: No Known Allergies  Medications: I have reviewed the patient's current medications.  Results for orders placed or performed during the hospital encounter of 11/04/16 (from the past 48 hour(s))  Comprehensive metabolic panel     Status: Abnormal   Collection Time: 11/04/16  4:00 PM  Result Value Ref Range   Sodium 141 135 - 145 mmol/L   Potassium 4.4 3.5 - 5.1 mmol/L   Chloride 110 101 - 111 mmol/L   CO2 20 (L) 22 - 32 mmol/L   Glucose, Bld 106 (H) 65 - 99 mg/dL   BUN 31 (H) 6 - 20 mg/dL   Creatinine, Ser 3.51 (H) 0.61 - 1.24 mg/dL   Calcium 9.1 8.9 - 10.3 mg/dL   Total Protein 7.4 6.5 - 8.1 g/dL   Albumin 4.1 3.5 - 5.0 g/dL   AST 21 15 - 41 U/L   ALT 12 (L) 17 - 63 U/L   Alkaline Phosphatase 54 38 - 126 U/L   Total Bilirubin 0.6 0.3 - 1.2 mg/dL   GFR calc non Af Amer 15 (L) >60 mL/min   GFR calc Af Amer 18 (L) >60 mL/min    Comment: (NOTE) The eGFR has been calculated using the CKD EPI equation. This calculation has not been validated in all clinical situations. eGFR's persistently <60 mL/min signify possible Chronic Kidney Disease.    Anion gap 11 5 - 15  CBC with Differential     Status: Abnormal   Collection Time: 11/04/16  4:00 PM  Result Value Ref Range   WBC 7.6 4.0 - 10.5 K/uL   RBC 3.08 (L) 4.22  - 5.81 MIL/uL   Hemoglobin 9.0 (L) 13.0 - 17.0 g/dL   HCT 27.6 (L) 39.0 - 52.0 %   MCV 89.6 78.0 - 100.0 fL   MCH 29.2 26.0 - 34.0 pg   MCHC 32.6 30.0 - 36.0 g/dL   RDW 14.1 11.5 - 15.5 %   Platelets 276 150 - 400 K/uL   Neutrophils Relative % 84 %   Neutro Abs 6.3 1.7 - 7.7 K/uL   Lymphocytes Relative 11 %   Lymphs Abs 0.8 0.7 - 4.0 K/uL   Monocytes Relative 5 %   Monocytes Absolute 0.4 0.1 - 1.0 K/uL   Eosinophils Relative 0 %   Eosinophils Absolute 0.0 0.0 - 0.7 K/uL   Basophils Relative 0 %   Basophils Absolute 0.0 0.0 - 0.1 K/uL  Ethanol     Status: None   Collection Time: 11/04/16  4:15 PM  Result Value Ref Range   Alcohol, Ethyl (B) <5 <5 mg/dL    Comment:        LOWEST DETECTABLE LIMIT FOR SERUM ALCOHOL IS 5 mg/dL FOR MEDICAL PURPOSES ONLY   Urinalysis, Routine w reflex microscopic     Status: Abnormal   Collection Time: 11/04/16  7:00  PM  Result Value Ref Range   Color, Urine RED (A) YELLOW    Comment: BIOCHEMICALS MAY BE AFFECTED BY COLOR   APPearance TURBID (A) CLEAR   Specific Gravity, Urine 1.011 1.005 - 1.030   pH 7.0 5.0 - 8.0   Glucose, UA NEGATIVE NEGATIVE mg/dL   Hgb urine dipstick LARGE (A) NEGATIVE   Bilirubin Urine NEGATIVE NEGATIVE   Ketones, ur NEGATIVE NEGATIVE mg/dL   Protein, ur 100 (A) NEGATIVE mg/dL   Nitrite NEGATIVE NEGATIVE   Leukocytes, UA NEGATIVE NEGATIVE  Urine rapid drug screen (hosp performed)     Status: None   Collection Time: 11/04/16  7:00 PM  Result Value Ref Range   Opiates NONE DETECTED NONE DETECTED   Cocaine NONE DETECTED NONE DETECTED   Benzodiazepines NONE DETECTED NONE DETECTED   Amphetamines NONE DETECTED NONE DETECTED   Tetrahydrocannabinol NONE DETECTED NONE DETECTED   Barbiturates NONE DETECTED NONE DETECTED    Comment:        DRUG SCREEN FOR MEDICAL PURPOSES ONLY.  IF CONFIRMATION IS NEEDED FOR ANY PURPOSE, NOTIFY LAB WITHIN 5 DAYS.        LOWEST DETECTABLE LIMITS FOR URINE DRUG SCREEN Drug Class        Cutoff (ng/mL) Amphetamine      1000 Barbiturate      200 Benzodiazepine   235 Tricyclics       573 Opiates          300 Cocaine          300 THC              50   Urine microscopic-add on     Status: Abnormal   Collection Time: 11/04/16  7:00 PM  Result Value Ref Range   Squamous Epithelial / LPF 0-5 (A) NONE SEEN   WBC, UA 6-30 0 - 5 WBC/hpf   RBC / HPF TOO NUMEROUS TO COUNT 0 - 5 RBC/hpf   Bacteria, UA RARE (A) NONE SEEN   Urine-Other URINALYSIS PERFORMED ON SUPERNATANT   Protime-INR     Status: None   Collection Time: 11/04/16  7:45 PM  Result Value Ref Range   Prothrombin Time 15.2 11.4 - 15.2 seconds   INR 1.19   APTT     Status: None   Collection Time: 11/04/16  7:45 PM  Result Value Ref Range   aPTT 27 24 - 36 seconds    Ct Abdomen Pelvis Wo Contrast  Result Date: 11/04/2016 CLINICAL DATA:  Pain after fall. Abdominal mass. Altered mental status. EXAM: CT ABDOMEN AND PELVIS WITHOUT CONTRAST TECHNIQUE: Multidetector CT imaging of the abdomen and pelvis was performed following the standard protocol without IV contrast. COMPARISON:  None. FINDINGS: Lower chest: The heart is enlarged. Hyperdense appearance of the cardiac walls and septum may be secondary to anemia. Faint calcifications noted in the left ventricle, possibly related to the mitral valve. No pericardial effusion. There is streaky atelectasis and/or scarring at each lung base left greater than right. No pneumonic consolidations. Tiny nodular density in the right lower lobe laterally consistent with a branch point for pulmonary vessel. Hepatobiliary: Nonspecific hypodensities in the left hepatic lobe the largest is 15 mm seen on series 201, image 13 with Hounsfield unit of 14. Findings are statistically consistent with cysts and/or hemangiomata. The lack of IV contrast limits further assessment. No biliary dilatation is noted. Gallbladder appears physiologically distended without calculus. Pancreas: The pancreas is  unremarkable for this unenhanced study. No ductal dilatation is  apparent. Spleen: There is no splenomegaly. Adrenals/Urinary Tract: Marked bilateral hydroureteronephrosis with marked distention of the bladder to 22.3 cm craniocaudad by 13 cm AP by 15.2 cm transverse. There is a calcification along the floor of the bladder measuring 12 by 8 by 5 mm possibly at the origin of the prostatic urethra. This may be causing obstruction. Foley catheter decompression is recommended. Neither adrenal gland is well visualized due to lack of oral and IV contrast. Stomach/Bowel: No bowel obstruction or definite inflammation. Stomach is not distended. Moderate amount of stool in the right colon and rectum. Vascular/Lymphatic: Aortoiliac atherosclerosis without aneurysm. No lymphadenopathy. Numerous pelvic phleboliths are seen bilaterally in the lower pelvis. Reproductive: Enlarged prostate with peripheral and central zone calcifications measuring up to 7 cm. Other: No bowel herniation. Trace fluid along the paracolic gutters. Musculoskeletal: No acute osseous abnormality. There is degenerative disc disease at L3-4, L4-5 and L5-S1 with associated mild-to-moderate neural foraminal encroachment at L4-5. IMPRESSION: Marked bilateral hydroureteronephrosis and bladder distention. A 12 x 8 x 5 mm bladder calcification is seen possibly at the origin of the prostatic urethra which may explain the marked bladder (volume of 4.4 liters) O and renal collecting system obstruction described. Foley catheter to drainage is recommended. Electronically Signed   By: Ashley Royalty M.D.   On: 11/04/2016 18:36   Dg Chest 2 View  Result Date: 11/04/2016 CLINICAL DATA:  Altered mental status EXAM: CHEST  2 VIEW COMPARISON:  None. FINDINGS: There is no focal parenchymal opacity. There is no pleural effusion or pneumothorax. There is stable cardiomegaly. The osseous structures are unremarkable. IMPRESSION: No active cardiopulmonary disease. Electronically  Signed   By: Kathreen Devoid   On: 11/04/2016 15:19   Ct Head Wo Contrast  Result Date: 11/04/2016 CLINICAL DATA:  Altered mental status with history of fall EXAM: CT HEAD WITHOUT CONTRAST TECHNIQUE: Contiguous axial images were obtained from the base of the skull through the vertex without intravenous contrast. COMPARISON:  None. 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 This is larger on the left side, and measures at least 2.5 cm in thickness. There is approximately 12 mm of midline shift to the right. Right-sided subdural hematoma measures 1.2 cm in maximum thickness along the right parietal convexity on coronal views. There is inter hemispheric extension of subdural blood anteriorly and posteriorly, right inter hemispheric subdural hematoma measures 8 mm in thickness. There is additional subdural blood along the right tentorium. There is compression and mass effect upon the lateral ventricles with asymmetric dilatation of the right atrium. Fourth ventricle is small but patent. There is mild effacement of the pre pontine and suprasellar cisterns. Diffuse bilateral brain swelling. There is no transtentorial herniation. Sagittal views demonstrate a possible 4 mm hypodense nodule within the posterior aspect of the pituitary gland. There is no focal mass. Vascular: No hyperdense vessels.  Carotid artery calcifications. Skull: No obvious fracture.  The mastoid air cells are clear. Sinuses/Orbits: Minimal mucosal thickening in the ethmoid sinuses. The globes appear intact. Other: None IMPRESSION: Large bilateral left greater than right acute on chronic subdural hematoma as with approximately 12 mm of midline shift to the right. There is inter hemispheric subdural hematoma as well as right tentorial subdural. There is significant mass effect on the lateral ventricles. Fourth ventricle is patent. There is mild effacement of the basilar cisterns with diffuse brain swelling  present. Possible 4 mm hypodense nodule in the posterior aspect of the pituitary gland. Eventual MRI evaluation may be considered  Critical Value/emergent results were called by telephone at the time of interpretation on 11/04/2016 at 6:37 pm to Dr. Ellender Hose , who verbally acknowledged these results. Electronically Signed   By: Donavan Foil M.D.   On: 11/04/2016 18:37    Review of Systems  Constitutional: Positive for malaise/fatigue.  HENT: Positive for hearing loss.   Eyes: Negative.   Respiratory: Negative.   Cardiovascular: Negative.   Gastrointestinal: Negative.   Genitourinary: Positive for urgency.  Musculoskeletal: Negative.   Skin: Negative.   Neurological: Positive for speech change, focal weakness and weakness.  Endo/Heme/Allergies: Negative.   Psychiatric/Behavioral: Negative.    Blood pressure 199/80, pulse 61, temperature 97.9 F (36.6 C), temperature source Rectal, resp. rate 14, SpO2 100 %. Physical Exam  Constitutional: He appears well-developed and well-nourished.  HENT:  Bruise left temporal region  Eyes: Conjunctivae and EOM are normal. Pupils are equal, round, and reactive to light.  Neck: Normal range of motion. Neck supple.  Neurological: He is alert. A cranial nerve deficit is present. Coordination abnormal.  Mild right facial droop Weakness Decreased hearing to voice Will follow some commands    Assessment/Plan: OR for urgent evacuation L panhemispheric mixed density subdural hematoma. I have explained the risks and benefits  Including but not limited too bleeding, hematoma recurrence, seizures, infection, need for more surgery, brain damage, stroke, coma, death, paralysis, weakness, change in personality, and other risks. Mr. Rosendahl wife understands and wishes to proceed.   Chevi Lim L 11/04/2016, 9:18 PM

## 2016-11-04 NOTE — Progress Notes (Signed)
Pharmacy Antibiotic Note  Jeremy Roach is a 80 y.o. male admitted on 11/04/2016 with lethargy.  Patient fell at home about 2 weeks PTA and hit his head.  Found to have subdural hematoma and he is currently in the OR.  Pharmacy has been consulted for Rocephin dosing for possible hydronephritis.  Plan: - Rocephin 2gm IV Q24H - Pharmacy will sign off.  Thank you for the consult!     Temp (24hrs), Avg:97.9 F (36.6 C), Min:97.9 F (36.6 C), Max:97.9 F (36.6 C)   Recent Labs Lab 11/04/16 1600  WBC 7.6  CREATININE 3.51*    CrCl cannot be calculated (Unknown ideal weight.).    No Known Allergies  Jeremy Roach D. Laney Potashang, PharmD, BCPS Pager:  [phone] 11/04/2016, 10:40 PM

## 2016-11-04 NOTE — ED Triage Notes (Signed)
Pt brought in by EMS due to having AMS. Per EMS pt fell 3 weeks ago and a new onset of confusion started Tuesday. Pt is a&ox2. Per family pt baseline is ambulatory and oriented. Pt has no c/o pain.

## 2016-11-04 NOTE — ED Notes (Signed)
Patient transported to CT 

## 2016-11-05 ENCOUNTER — Encounter (HOSPITAL_COMMUNITY): Payer: Self-pay | Admitting: Neurosurgery

## 2016-11-05 ENCOUNTER — Inpatient Hospital Stay (HOSPITAL_COMMUNITY): Payer: Medicare Other

## 2016-11-05 LAB — CBC
HEMATOCRIT: 24.8 % — AB (ref 39.0–52.0)
Hemoglobin: 8 g/dL — ABNORMAL LOW (ref 13.0–17.0)
MCH: 28.8 pg (ref 26.0–34.0)
MCHC: 32.3 g/dL (ref 30.0–36.0)
MCV: 89.2 fL (ref 78.0–100.0)
PLATELETS: 255 10*3/uL (ref 150–400)
RBC: 2.78 MIL/uL — ABNORMAL LOW (ref 4.22–5.81)
RDW: 14.3 % (ref 11.5–15.5)
WBC: 10.1 10*3/uL (ref 4.0–10.5)

## 2016-11-05 LAB — BASIC METABOLIC PANEL
ANION GAP: 6 (ref 5–15)
BUN: 33 mg/dL — AB (ref 6–20)
CALCIUM: 8.1 mg/dL — AB (ref 8.9–10.3)
CO2: 21 mmol/L — AB (ref 22–32)
CREATININE: 3.25 mg/dL — AB (ref 0.61–1.24)
Chloride: 115 mmol/L — ABNORMAL HIGH (ref 101–111)
GFR calc Af Amer: 19 mL/min — ABNORMAL LOW (ref >60)
GFR, EST NON AFRICAN AMERICAN: 17 mL/min — AB (ref >60)
GLUCOSE: 127 mg/dL — AB (ref 65–99)
Potassium: 4.3 mmol/L (ref 3.5–5.1)
Sodium: 142 mmol/L (ref 135–145)

## 2016-11-05 LAB — MAGNESIUM: Magnesium: 1.8 mg/dL (ref 1.7–2.4)

## 2016-11-05 LAB — MRSA PCR SCREENING: MRSA BY PCR: NEGATIVE

## 2016-11-05 LAB — PHOSPHORUS: Phosphorus: 4.4 mg/dL (ref 2.5–4.6)

## 2016-11-05 MED ORDER — LABETALOL HCL 5 MG/ML IV SOLN
INTRAVENOUS | Status: DC | PRN
  Administered 2016-11-05 (×2): 5 mg via INTRAVENOUS

## 2016-11-05 MED ORDER — ONDANSETRON HCL 4 MG PO TABS: 4.0000 mg | ORAL_TABLET | ORAL | Status: DC | PRN

## 2016-11-05 MED ORDER — LORAZEPAM 2 MG/ML IJ SOLN: 1.0000 mg | INTRAMUSCULAR | Status: DC | PRN

## 2016-11-05 MED ORDER — LABETALOL HCL 5 MG/ML IV SOLN
INTRAVENOUS | Status: AC
  Filled 2016-11-05: qty 4

## 2016-11-05 MED ORDER — FOLIC ACID 1 MG PO TABS
1.0000 mg | ORAL_TABLET | Freq: Every day | ORAL | Status: DC
  Filled 2016-11-05: qty 1

## 2016-11-05 MED ORDER — FAMOTIDINE IN NACL 20-0.9 MG/50ML-% IV SOLN
20.0000 mg | INTRAVENOUS | Status: DC
  Administered 2016-11-05: 20 mg via INTRAVENOUS
  Filled 2016-11-05: qty 50

## 2016-11-05 MED ORDER — SODIUM CHLORIDE 0.9 % IV SOLN
500.0000 mg | Freq: Two times a day (BID) | INTRAVENOUS | Status: DC
  Administered 2016-11-05 – 2016-11-08 (×8): 500 mg via INTRAVENOUS
  Filled 2016-11-05 (×11): qty 5

## 2016-11-05 MED ORDER — NALOXONE HCL 0.4 MG/ML IJ SOLN: 0.0800 mg | INTRAMUSCULAR | Status: DC | PRN

## 2016-11-05 MED ORDER — BACITRACIN ZINC 500 UNIT/GM EX OINT
TOPICAL_OINTMENT | CUTANEOUS | Status: AC
  Filled 2016-11-05: qty 28.35

## 2016-11-05 MED ORDER — HYDROCODONE-ACETAMINOPHEN 5-325 MG PO TABS: 1.0000 | ORAL_TABLET | ORAL | Status: DC | PRN

## 2016-11-05 MED ORDER — PROMETHAZINE HCL 25 MG PO TABS: 12.5000 mg | ORAL_TABLET | ORAL | Status: DC | PRN

## 2016-11-05 MED ORDER — MORPHINE SULFATE (PF) 2 MG/ML IV SOLN: 1.0000 mg | INTRAVENOUS | Status: DC | PRN

## 2016-11-05 MED ORDER — HEPARIN SODIUM (PORCINE) 5000 UNIT/ML IJ SOLN: 5000.0000 [IU] | Freq: Three times a day (TID) | INTRAMUSCULAR | Status: DC

## 2016-11-05 MED ORDER — ONDANSETRON HCL 4 MG/2ML IJ SOLN: 4.0000 mg | INTRAMUSCULAR | Status: DC | PRN

## 2016-11-05 MED ORDER — FAMOTIDINE 20 MG PO TABS
20.0000 mg | ORAL_TABLET | Freq: Every day | ORAL | Status: DC
  Administered 2016-11-08: 20 mg via ORAL
  Filled 2016-11-05 (×3): qty 1

## 2016-11-05 MED ORDER — VITAMIN B-1 100 MG PO TABS
100.0000 mg | ORAL_TABLET | Freq: Every day | ORAL | Status: DC
  Filled 2016-11-05: qty 1

## 2016-11-05 MED ORDER — SUGAMMADEX SODIUM 200 MG/2ML IV SOLN
INTRAVENOUS | Status: DC | PRN
  Administered 2016-11-05: 200 mg via INTRAVENOUS

## 2016-11-05 MED ORDER — NICARDIPINE HCL IN NACL 20-0.86 MG/200ML-% IV SOLN
3.0000 mg/h | INTRAVENOUS | Status: DC
  Administered 2016-11-05: 5 mg/h via INTRAVENOUS
  Administered 2016-11-05: 8 mg/h via INTRAVENOUS
  Administered 2016-11-05 (×2): 5 mg/h via INTRAVENOUS
  Administered 2016-11-05: 8 mg/h via INTRAVENOUS
  Administered 2016-11-05 – 2016-11-06 (×3): 5 mg/h via INTRAVENOUS
  Filled 2016-11-05 (×7): qty 200

## 2016-11-05 MED ORDER — ONDANSETRON HCL 4 MG/2ML IJ SOLN
INTRAMUSCULAR | Status: DC | PRN
  Administered 2016-11-05: 4 mg via INTRAVENOUS

## 2016-11-05 NOTE — Transfer of Care (Signed)
Immediate Anesthesia Transfer of Care Note  Patient: Jeremy Roach  Procedure(s) Performed: Procedure(s): CRANIOTOMY HEMATOMA EVACUATION SUBDURAL (Left)  Patient Location: PACU  Anesthesia Type:General  Level of Consciousness: sedated and responds to stimulation  Airway & Oxygen Therapy: Patient connected to nasal cannula oxygen  Post-op Assessment: Report given to RN and Post -op Vital signs reviewed and stable  Post vital signs: Reviewed and stable  Last Vitals:  Vitals:   11/04/16 2115 11/05/16 0047  BP: 195/88   Pulse: (!) 58 (!) 53  Resp: 14   Temp:  36.3 C    Last Pain:  Vitals:   11/04/16 1440  TempSrc: Rectal         Complications: No apparent anesthesia complications

## 2016-11-05 NOTE — Consult Note (Signed)
Urology Consult   Physician requesting consult: Coralyn Helling  Reason for consult: AUR and bilateral hydroureteronephrosis with ARI  History of Present Illness: Jeremy Roach is a 80 y.o. 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 He is s/p craniotomy 11/04/16.  On exam at time of admission he was also noted to have an abdominal mass and therefore CT A/P was obtained.  This revealed severe bilateral hydroureteronephrosis and marked bladder distention with a large bladder calculus. UA was nitrite negative with large Hbg, no leukocytes, and rare bacteria.  WBC 10.  Cr 3.51 on admission and 3.25 today after foley placement.   Pt is awake with some confusion.  He is not oriented to place and time but knows his name, dob, and place of birth.  He will not answer questions directly.  For example, when asked about voiding symptoms he says "its normal for me.  I can go 5 hours without peeing but nobody else on earth can. You can't tell me my pathology because all your numbers and tests are based on young people not old people".  He does state he did not have any abdominal pain at the time of admission or in the recent past.   He wants to leave and does not want to answer questions about his history. No family members are present.   .  Past Surgical History:  Procedure Laterality Date  . CRANIOTOMY Left 11/04/2016   Procedure: CRANIOTOMY HEMATOMA EVACUATION SUBDURAL;  Surgeon: Coletta Memos, MD;  Location: [facility] OR;  Service: Neurosurgery;  Laterality: Left;    Current Hospital Medications:  Home Meds:   Scheduled Meds: . cefTRIAXone (ROCEPHIN)  IV  2 g Intravenous Q24H  . famotidine  20 mg Oral QHS  . folic acid  1 mg Oral Daily  . [START ON 11/06/2016] heparin subcutaneous  5,000 Units Subcutaneous Q8H  . levETIRAcetam  500 mg Intravenous Q12H  . thiamine  100 mg Oral Daily   Continuous  Infusions: . sodium chloride 50 mL/hr at 11/05/16 0230  . niCARDipine 8 mg/hr (11/05/16 1200)   PRN Meds:.HYDROcodone-acetaminophen, LORazepam, morphine injection, naLOXone (NARCAN)  injection, ondansetron **OR** ondansetron (ZOFRAN) IV, promethazine  Allergies: No Known Allergies  History reviewed. No pertinent family history.  Social History:  reports that he has never smoked. He has never used smokeless tobacco. He reports that he does not drink alcohol or use drugs.  ROS: A complete review of systems cannot be completed as pt has some confusion and will not answer questions.   Physical Exam:  Vital signs in last 24 hours: Temp:  [97.4 F (36.3 C)-98.4 F (36.9 C)] 98.4 F (36.9 C) (11/10 1200) Pulse Rate:  [53-84] 82 (11/10 1200) Resp:  [9-22] 16 (11/10 1200) BP: (122-217)/(64-156) 159/71 (11/10 1200) SpO2:  [96 %-100 %] 100 % (11/10 1200) Arterial Line BP: (111-213)/(59-106) 148/65 (11/10 1030) Weight:  [68.2 kg (150 lb 5.7 oz)] 68.2 kg (150 lb 5.7 oz) (11/10 0218) Constitutional:  Alert; partially oriented, No acute distress Cardiovascular: Regular rate and rhythm Respiratory: Normal respiratory effort GI: Abdomen is soft, nontender, nondistended, no abdominal masses GU: foley in place with red urine in bag Lymphatic: No lymphadenopathy Neurologic: Grossly intact, no focal deficits Psychiatric: Normal mood and affect  Laboratory Data:   Recent Labs  11/04/16 1600 11/05/16 0440  WBC 7.6 10.1  HGB 9.0* 8.0*  HCT 27.6* 24.8*  PLT  276 255     Recent Labs  11/04/16 1600 11/05/16 0440  NA 141 142  K 4.4 4.3  CL 110 115*  GLUCOSE 106* 127*  BUN 31* 33*  CALCIUM 9.1 8.1*  CREATININE 3.51* 3.25*     Results for orders placed or performed during the hospital encounter of 11/04/16 (from the past 24 hour(s))  Comprehensive metabolic panel     Status: Abnormal   Collection Time: 11/04/16  4:00 PM  Result Value Ref Range   Sodium 141 135 - 145 mmol/L    Potassium 4.4 3.5 - 5.1 mmol/L   Chloride 110 101 - 111 mmol/L   CO2 20 (L) 22 - 32 mmol/L   Glucose, Bld 106 (H) 65 - 99 mg/dL   BUN 31 (H) 6 - 20 mg/dL   Creatinine, Ser 1.61 (H) 0.61 - 1.24 mg/dL   Calcium 9.1 8.9 - 09.6 mg/dL   Total Protein 7.4 6.5 - 8.1 g/dL   Albumin 4.1 3.5 - 5.0 g/dL   AST 21 15 - 41 U/L   ALT 12 (L) 17 - 63 U/L   Alkaline Phosphatase 54 38 - 126 U/L   Total Bilirubin 0.6 0.3 - 1.2 mg/dL   GFR calc non Af Amer 15 (L) >60 mL/min   GFR calc Af Amer 18 (L) >60 mL/min   Anion gap 11 5 - 15  CBC with Differential     Status: Abnormal   Collection Time: 11/04/16  4:00 PM  Result Value Ref Range   WBC 7.6 4.0 - 10.5 K/uL   RBC 3.08 (L) 4.22 - 5.81 MIL/uL   Hemoglobin 9.0 (L) 13.0 - 17.0 g/dL   HCT 04.5 (L) 40.9 - 81.1 %   MCV 89.6 78.0 - 100.0 fL   MCH 29.2 26.0 - 34.0 pg   MCHC 32.6 30.0 - 36.0 g/dL   RDW 91.4 78.2 - 95.6 %   Platelets 276 150 - 400 K/uL   Neutrophils Relative % 84 %   Neutro Abs 6.3 1.7 - 7.7 K/uL   Lymphocytes Relative 11 %   Lymphs Abs 0.8 0.7 - 4.0 K/uL   Monocytes Relative 5 %   Monocytes Absolute 0.4 0.1 - 1.0 K/uL   Eosinophils Relative 0 %   Eosinophils Absolute 0.0 0.0 - 0.7 K/uL   Basophils Relative 0 %   Basophils Absolute 0.0 0.0 - 0.1 K/uL  Ethanol     Status: None   Collection Time: 11/04/16  4:15 PM  Result Value Ref Range   Alcohol, Ethyl (B) <5 <5 mg/dL  Urinalysis, Routine w reflex microscopic     Status: Abnormal   Collection Time: 11/04/16  7:00 PM  Result Value Ref Range   Color, Urine RED (A) YELLOW   APPearance TURBID (A) CLEAR   Specific Gravity, Urine 1.011 1.005 - 1.030   pH 7.0 5.0 - 8.0   Glucose, UA NEGATIVE NEGATIVE mg/dL   Hgb urine dipstick LARGE (A) NEGATIVE   Bilirubin Urine NEGATIVE NEGATIVE   Ketones, ur NEGATIVE NEGATIVE mg/dL   Protein, ur 213 (A) NEGATIVE mg/dL   Nitrite NEGATIVE NEGATIVE   Leukocytes, UA NEGATIVE NEGATIVE  Urine rapid drug screen (hosp performed)     Status: None    Collection Time: 11/04/16  7:00 PM  Result Value Ref Range   Opiates NONE DETECTED NONE DETECTED   Cocaine NONE DETECTED NONE DETECTED   Benzodiazepines NONE DETECTED NONE DETECTED   Amphetamines NONE DETECTED NONE DETECTED   Tetrahydrocannabinol NONE  DETECTED NONE DETECTED   Barbiturates NONE DETECTED NONE DETECTED  Urine microscopic-add on     Status: Abnormal   Collection Time: 11/04/16  7:00 PM  Result Value Ref Range   Squamous Epithelial / LPF 0-5 (A) NONE SEEN   WBC, UA 6-30 0 - 5 WBC/hpf   RBC / HPF TOO NUMEROUS TO COUNT 0 - 5 RBC/hpf   Bacteria, UA RARE (A) NONE SEEN   Urine-Other URINALYSIS PERFORMED ON SUPERNATANT   Protime-INR     Status: None   Collection Time: 11/04/16  7:45 PM  Result Value Ref Range   Prothrombin Time 15.2 11.4 - 15.2 seconds   INR 1.19   APTT     Status: None   Collection Time: 11/04/16  7:45 PM  Result Value Ref Range   aPTT 27 24 - 36 seconds  MRSA PCR Screening     Status: None   Collection Time: 11/05/16  2:18 AM  Result Value Ref Range   MRSA by PCR NEGATIVE NEGATIVE  CBC     Status: Abnormal   Collection Time: 11/05/16  4:40 AM  Result Value Ref Range   WBC 10.1 4.0 - 10.5 K/uL   RBC 2.78 (L) 4.22 - 5.81 MIL/uL   Hemoglobin 8.0 (L) 13.0 - 17.0 g/dL   HCT 29.5 (L) 62.1 - 30.8 %   MCV 89.2 78.0 - 100.0 fL   MCH 28.8 26.0 - 34.0 pg   MCHC 32.3 30.0 - 36.0 g/dL   RDW 65.7 84.6 - 96.2 %   Platelets 255 150 - 400 K/uL  Basic metabolic panel     Status: Abnormal   Collection Time: 11/05/16  4:40 AM  Result Value Ref Range   Sodium 142 135 - 145 mmol/L   Potassium 4.3 3.5 - 5.1 mmol/L   Chloride 115 (H) 101 - 111 mmol/L   CO2 21 (L) 22 - 32 mmol/L   Glucose, Bld 127 (H) 65 - 99 mg/dL   BUN 33 (H) 6 - 20 mg/dL   Creatinine, Ser 9.52 (H) 0.61 - 1.24 mg/dL   Calcium 8.1 (L) 8.9 - 10.3 mg/dL   GFR calc non Af Amer 17 (L) >60 mL/min   GFR calc Af Amer 19 (L) >60 mL/min   Anion gap 6 5 - 15  Magnesium     Status: None   Collection  Time: 11/05/16  4:40 AM  Result Value Ref Range   Magnesium 1.8 1.7 - 2.4 mg/dL  Phosphorus     Status: None   Collection Time: 11/05/16  4:40 AM  Result Value Ref Range   Phosphorus 4.4 2.5 - 4.6 mg/dL   Recent Results (from the past 240 hour(s))  MRSA PCR Screening     Status: None   Collection Time: 11/05/16  2:18 AM  Result Value Ref Range Status   MRSA by PCR NEGATIVE NEGATIVE Final    Comment:        The GeneXpert MRSA Assay (FDA approved for NASAL specimens only), is one component of a comprehensive MRSA colonization surveillance program. It is not intended to diagnose MRSA infection nor to guide or monitor treatment for MRSA infections.     Renal Function:  Recent Labs  11/04/16 1600 11/05/16 0440  CREATININE 3.51* 3.25*   Estimated Creatinine Clearance: 18.1 mL/min (by C-G formula based on SCr of 3.25 mg/dL (H)).  Radiologic Imaging: Ct Abdomen Pelvis Wo Contrast  Result Date: 11/04/2016 CLINICAL DATA:  Pain after fall. Abdominal mass. Altered mental  status. EXAM: CT ABDOMEN AND PELVIS WITHOUT CONTRAST TECHNIQUE: Multidetector CT imaging of the abdomen and pelvis was performed following the standard protocol without IV contrast. COMPARISON:  None. FINDINGS: Lower chest: The heart is enlarged. Hyperdense appearance of the cardiac walls and septum may be secondary to anemia. Faint calcifications noted in the left ventricle, possibly related to the mitral valve. No pericardial effusion. There is streaky atelectasis and/or scarring at each lung base left greater than right. No pneumonic consolidations. Tiny nodular density in the right lower lobe laterally consistent with a branch point for pulmonary vessel. Hepatobiliary: Nonspecific hypodensities in the left hepatic lobe the largest is 15 mm seen on series 201, image 13 with Hounsfield unit of 14. Findings are statistically consistent with cysts and/or hemangiomata. The lack of IV contrast limits further assessment. No  biliary dilatation is noted. Gallbladder appears physiologically distended without calculus. Pancreas: The pancreas is unremarkable for this unenhanced study. No ductal dilatation is apparent. Spleen: There is no splenomegaly. Adrenals/Urinary Tract: Marked bilateral hydroureteronephrosis with marked distention of the bladder to 22.3 cm craniocaudad by 13 cm AP by 15.2 cm transverse. There is a calcification along the floor of the bladder measuring 12 by 8 by 5 mm possibly at the origin of the prostatic urethra. This may be causing obstruction. Foley catheter decompression is recommended. Neither adrenal gland is well visualized due to lack of oral and IV contrast. Stomach/Bowel: No bowel obstruction or definite inflammation. Stomach is not distended. Moderate amount of stool in the right colon and rectum. Vascular/Lymphatic: Aortoiliac atherosclerosis without aneurysm. No lymphadenopathy. Numerous pelvic phleboliths are seen bilaterally in the lower pelvis. Reproductive: Enlarged prostate with peripheral and central zone calcifications measuring up to 7 cm. Other: No bowel herniation. Trace fluid along the paracolic gutters. Musculoskeletal: No acute osseous abnormality. There is degenerative disc disease at L3-4, L4-5 and L5-S1 with associated mild-to-moderate neural foraminal encroachment at L4-5. IMPRESSION: Marked bilateral hydroureteronephrosis and bladder distention. A 12 x 8 x 5 mm bladder calcification is seen possibly at the origin of the prostatic urethra which may explain the marked bladder (volume of 4.4 liters) O and renal collecting system obstruction described. Foley catheter to drainage is recommended. Electronically Signed   By: Tollie Ethavid  Kwon M.D.   On: 11/04/2016 18:36   Dg Chest 2 View  Result Date: 11/04/2016 CLINICAL DATA:  Altered mental status EXAM: CHEST  2 VIEW COMPARISON:  None. FINDINGS: There is no focal parenchymal opacity. There is no pleural effusion or pneumothorax. There is stable  cardiomegaly. The osseous structures are unremarkable. IMPRESSION: No active cardiopulmonary disease. Electronically Signed   By: Elige KoHetal  Patel   On: 11/04/2016 15:19   Ct Head Wo Contrast  Result Date: 11/04/2016 CLINICAL DATA:  Altered mental status with history of fall EXAM: CT HEAD WITHOUT CONTRAST TECHNIQUE: Contiguous axial images were obtained from the base of the skull through the vertex without intravenous contrast. COMPARISON:  None. 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 This is larger on the left side, and measures at least 2.5 cm in thickness. There is approximately 12 mm of midline shift to the right. Right-sided subdural hematoma measures 1.2 cm in maximum thickness along the right parietal convexity on coronal views. There is inter hemispheric extension of subdural blood anteriorly and posteriorly, right inter hemispheric subdural hematoma measures 8 mm in thickness. There is additional subdural blood along the right tentorium. There is compression and mass effect upon the lateral ventricles with asymmetric dilatation of the  right atrium. Fourth ventricle is small but patent. There is mild effacement of the pre pontine and suprasellar cisterns. Diffuse bilateral brain swelling. There is no transtentorial herniation. Sagittal views demonstrate a possible 4 mm hypodense nodule within the posterior aspect of the pituitary gland. There is no focal mass. Vascular: No hyperdense vessels.  Carotid artery calcifications. Skull: No obvious fracture.  The mastoid air cells are clear. Sinuses/Orbits: Minimal mucosal thickening in the ethmoid sinuses. The globes appear intact. Other: None IMPRESSION: Large bilateral left greater than right acute on chronic subdural hematoma as with approximately 12 mm of midline shift to the right. There is inter hemispheric subdural hematoma as well as right tentorial subdural. There is significant mass effect on the lateral  ventricles. Fourth ventricle is patent. There is mild effacement of the basilar cisterns with diffuse brain swelling present. Possible 4 mm hypodense nodule in the posterior aspect of the pituitary gland. Eventual MRI evaluation may be considered Critical Value/emergent results were called by telephone at the time of interpretation on 11/04/2016 at 6:37 pm to Dr. Erma HeritageIsaacs , who verbally acknowledged these results. Electronically Signed   By: Jasmine PangKim  Fujinaga M.D.   On: 11/04/2016 18:37   Dg Chest Port 1 View  Result Date: 11/05/2016 CLINICAL DATA:  Postop and craniotomy with subdural hematoma evacuation. EXAM: PORTABLE CHEST 1 VIEW COMPARISON:  None. FINDINGS: Cardiac enlargement without vascular congestion. No focal lung consolidation or edema. No blunting of costophrenic angles. No pneumothorax. Mediastinal contours appear intact. Degenerative changes in the spine and shoulders. IMPRESSION: Cardiac enlargement.  No evidence of active pulmonary disease. Electronically Signed   By: Burman NievesWilliam  Stevens M.D.   On: 11/05/2016 02:11     Impression/Recommendation  Urinary retention with marked bladder distention and bilateral hydroureteronephrosis--this is chronic as evidenced by the amount of distention and the fact that the pt did not have any pain.  The bladder calculus has also been developing for quite some time. The calculus may contribute to some degree of intermittent obstruction but is not the root cause.  This more likely started with BPH and then the pt developed a stretch injury over time.  Check renal U/S in the next 24-48 hours to ensure hydro is improving.  He will need to maintain a foley for several weeks with subsequent void trial, however, his bladder may remain chronically dysfunctional with such a significant stretch injury.  Long term he may require indwelling cath or CIC.  His urine is not impressive for a UTI and no culture was obtained.   ARI--continue to monitor. Should improve as hydro  resolves and pt is hydrated. No baseline Cr was available for review.   Hematuria--due to bladder calculus and possibly bladder stretch injury.  Foley placement will also contribute.  DANCY, AMANDA 11/05/2016, 1:02 PM    I have seen and examined the patient and agree with the above assessment and plan.  Continue urethral catheter and monitor renal function and for post-obstructive diuresis.

## 2016-11-05 NOTE — Progress Notes (Signed)
PULMONARY / CRITICAL CARE MEDICINE   Name: Grover CanavanBobby Colville MRN: [id number] DOB: 02/04/1936    ADMISSION DATE:  11/04/2016  REFERRING MD:  Dr. Franky Machoabbell  CHIEF COMPLAINT:  Lethargy  HISTORY OF PRESENT ILLNESS:   80 yo with progressive altered mental status.  Found to have acute on chronic b/l SDH, renal failure with b/l hydroureteronephrosis.  Reported to have 30 lbs weight loss.  SUBJECTIVE:  Says he was born on his head, and this is nothin.  Wants to know when nature will be given a chance.  Says he doesn't drink alcohol, but his stomach makes alcohol out of water.   VITAL SIGNS: BP (!) 151/118   Pulse 71   Temp 97.4 F (36.3 C) (Oral)   Resp 17   Ht 5\' 10"  (1.778 m)   Wt 150 lb 5.7 oz (68.2 kg)   SpO2 98%   BMI 21.57 kg/m   INTAKE / OUTPUT: I/O last 3 completed shifts: In: 3015 [I.V.:1860; IV Piggyback:1155] Out: 2645 [Urine:2225; Drains:345; Blood:75]  PHYSICAL EXAMINATION: General: thin Neuro: Alert, follows commands, very confused HEENT: Drain in brain Cardiovascular: regular, no murmur Lungs: no wheeze Abdomen: soft, non tender Musculoskeletal: no edema Skin: no rashes  LABS:  BMET  Recent Labs Lab 11/04/16 1600 11/05/16 0440  NA 141 142  K 4.4 4.3  CL 110 115*  CO2 20* 21*  BUN 31* 33*  CREATININE 3.51* 3.25*  GLUCOSE 106* 127*    Electrolytes  Recent Labs Lab 11/04/16 1600 11/05/16 0440  CALCIUM 9.1 8.1*  MG  --  1.8  PHOS  --  4.4    CBC  Recent Labs Lab 11/04/16 1600 11/05/16 0440  WBC 7.6 10.1  HGB 9.0* 8.0*  HCT 27.6* 24.8*  PLT 276 255    Coag's  Recent Labs Lab 11/04/16 1945  APTT 27  INR 1.19    Sepsis Markers No results for input(s): LATICACIDVEN, PROCALCITON, O2SATVEN in the last 168 hours.  ABG No results for input(s): PHART, PCO2ART, PO2ART in the last 168 hours.  Liver Enzymes  Recent Labs Lab 11/04/16 1600  AST 21  ALT 12*  ALKPHOS 54  BILITOT 0.6  ALBUMIN 4.1    Cardiac Enzymes No results for  input(s): TROPONINI, PROBNP in the last 168 hours.  Glucose No results for input(s): GLUCAP in the last 168 hours.  Imaging Ct Abdomen Pelvis Wo Contrast  Result Date: 11/04/2016 CLINICAL DATA:  Pain after fall. Abdominal mass. Altered mental status. EXAM: CT ABDOMEN AND PELVIS WITHOUT CONTRAST TECHNIQUE: Multidetector CT imaging of the abdomen and pelvis was performed following the standard protocol without IV contrast. COMPARISON:  None. FINDINGS: Lower chest: The heart is enlarged. Hyperdense appearance of the cardiac walls and septum may be secondary to anemia. Faint calcifications noted in the left ventricle, possibly related to the mitral valve. No pericardial effusion. There is streaky atelectasis and/or scarring at each lung base left greater than right. No pneumonic consolidations. Tiny nodular density in the right lower lobe laterally consistent with a branch point for pulmonary vessel. Hepatobiliary: Nonspecific hypodensities in the left hepatic lobe the largest is 15 mm seen on series 201, image 13 with Hounsfield unit of 14. Findings are statistically consistent with cysts and/or hemangiomata. The lack of IV contrast limits further assessment. No biliary dilatation is noted. Gallbladder appears physiologically distended without calculus. Pancreas: The pancreas is unremarkable for this unenhanced study. No ductal dilatation is apparent. Spleen: There is no splenomegaly. Adrenals/Urinary Tract: Marked bilateral hydroureteronephrosis with marked  distention of the bladder to 22.3 cm craniocaudad by 13 cm AP by 15.2 cm transverse. There is a calcification along the floor of the bladder measuring 12 by 8 by 5 mm possibly at the origin of the prostatic urethra. This may be causing obstruction. Foley catheter decompression is recommended. Neither adrenal gland is well visualized due to lack of oral and IV contrast. Stomach/Bowel: No bowel obstruction or definite inflammation. Stomach is not distended.  Moderate amount of stool in the right colon and rectum. Vascular/Lymphatic: Aortoiliac atherosclerosis without aneurysm. No lymphadenopathy. Numerous pelvic phleboliths are seen bilaterally in the lower pelvis. Reproductive: Enlarged prostate with peripheral and central zone calcifications measuring up to 7 cm. Other: No bowel herniation. Trace fluid along the paracolic gutters. Musculoskeletal: No acute osseous abnormality. There is degenerative disc disease at L3-4, L4-5 and L5-S1 with associated mild-to-moderate neural foraminal encroachment at L4-5. IMPRESSION: Marked bilateral hydroureteronephrosis and bladder distention. A 12 x 8 x 5 mm bladder calcification is seen possibly at the origin of the prostatic urethra which may explain the marked bladder (volume of 4.4 liters) O and renal collecting system obstruction described. Foley catheter to drainage is recommended. Electronically Signed   By: Tollie Eth M.D.   On: 11/04/2016 18:36   Dg Chest 2 View  Result Date: 11/04/2016 CLINICAL DATA:  Altered mental status EXAM: CHEST  2 VIEW COMPARISON:  None. FINDINGS: There is no focal parenchymal opacity. There is no pleural effusion or pneumothorax. There is stable cardiomegaly. The osseous structures are unremarkable. IMPRESSION: No active cardiopulmonary disease. Electronically Signed   By: Elige Ko   On: 11/04/2016 15:19   Ct Head Wo Contrast  Result Date: 11/04/2016 CLINICAL DATA:  Altered mental status with history of fall EXAM: CT HEAD WITHOUT CONTRAST TECHNIQUE: Contiguous axial images were obtained from the base of the skull through the vertex without intravenous contrast. COMPARISON:  None. 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 This is larger on the left side, and measures at least 2.5 cm in thickness. There is approximately 12 mm of midline shift to the right. Right-sided subdural hematoma measures 1.2 cm in maximum thickness along the right  parietal convexity on coronal views. There is inter hemispheric extension of subdural blood anteriorly and posteriorly, right inter hemispheric subdural hematoma measures 8 mm in thickness. There is additional subdural blood along the right tentorium. There is compression and mass effect upon the lateral ventricles with asymmetric dilatation of the right atrium. Fourth ventricle is small but patent. There is mild effacement of the pre pontine and suprasellar cisterns. Diffuse bilateral brain swelling. There is no transtentorial herniation. Sagittal views demonstrate a possible 4 mm hypodense nodule within the posterior aspect of the pituitary gland. There is no focal mass. Vascular: No hyperdense vessels.  Carotid artery calcifications. Skull: No obvious fracture.  The mastoid air cells are clear. Sinuses/Orbits: Minimal mucosal thickening in the ethmoid sinuses. The globes appear intact. Other: None IMPRESSION: Large bilateral left greater than right acute on chronic subdural hematoma as with approximately 12 mm of midline shift to the right. There is inter hemispheric subdural hematoma as well as right tentorial subdural. There is significant mass effect on the lateral ventricles. Fourth ventricle is patent. There is mild effacement of the basilar cisterns with diffuse brain swelling present. Possible 4 mm hypodense nodule in the posterior aspect of the pituitary gland. Eventual MRI evaluation may be considered Critical Value/emergent results were called by telephone at the time of interpretation on  11/04/2016 at 6:37 pm to Dr. Erma HeritageIsaacs , who verbally acknowledged these results. Electronically Signed   By: Jasmine PangKim  Fujinaga M.D.   On: 11/04/2016 18:37   Dg Chest Port 1 View  Result Date: 11/05/2016 CLINICAL DATA:  Postop and craniotomy with subdural hematoma evacuation. EXAM: PORTABLE CHEST 1 VIEW COMPARISON:  None. FINDINGS: Cardiac enlargement without vascular congestion. No focal lung consolidation or edema. No  blunting of costophrenic angles. No pneumothorax. Mediastinal contours appear intact. Degenerative changes in the spine and shoulders. IMPRESSION: Cardiac enlargement.  No evidence of active pulmonary disease. Electronically Signed   By: Burman NievesWilliam  Stevens M.D.   On: 11/05/2016 02:11     STUDIES:  CT head 11/9 > Large bilateral left greater than right acute on chronic subdural hematoma as with approximately 12 mm of midline shift to the right. There is inter hemispheric subdural hematoma as well as right tentorial subdural. There is significant mass effect on the lateral ventricles. Fourth ventricle is patent. There is mild effacement of the basilar cisterns with diffuse brain swelling present. Possible 4 mm hypodense nodule in the posterior aspect of the pituitary gland. Eventual MRI evaluation may be considered. CT abdomen 11/9 > Marked bilateral hydroureteronephrosis and bladder distention. A 12 x 8 x 5 mm bladder calcification is seen possibly at the origin of the prostatic urethra which may explain the marked bladder (volume of 4.4 liters) O and renal collecting system obstruction described. Foley catheter to drainage is recommended.  CULTURES: Urine Cx 11/9 >  ANTIBIOTICS: Ceftriaxone 11/9 >  SIGNIFICANT EVENTS: 11/9 admit, to OR for SDH evac  LINES/TUBES: Brain drain 11/09 >>  Rt radial aline 11/09 >>  DISCUSSION: 80 year old male admitted 11/9 with SDH after fall 2 weeks ago. Also with bladder outlet obstruction and bilateral hydro.   ASSESSMENT / PLAN:  Acute encephalopathy 2nd to acute on chronic SDH s/p evacuation 11/09. - post op care, AEDs per neurosurgery  Acute delirium ?if he has hx of ETOH. - CIWA - thiamine, folic acid, MVI  HTN emergency. - cardene gtt to keep SBP 140 to 160  B/l hydroureteronephrosis with bladder calcification at origin of prostatic urethra. AKI >> uncertain about baseline renal function. - continue foley - will need urology  assessment  UTI - Day 2 of rocephin  Anemia of critical illness. - f/u CBC  Moderate protein calorie malnutrition. - clear liquid diet  DVT prophylaxis - SCDs SUP - protonix Goal of care - Full code  CC time 32 minutes.  Coralyn HellingVineet Jesus Nevills, MD [facility] Pulmonary/Critical Care 11/05/2016, 7:23 AM Pager:  [phone] After 3pm call: [phone]

## 2016-11-05 NOTE — Progress Notes (Signed)
Patient ID: Grover CanavanBobby Lackie, male   DOB: 09/16/1936, 80 y.o.   MRN: [id number] BP (!) 141/73   Pulse 78   Temp 97.5 F (36.4 C) (Oral)   Resp 11   Ht 5\' 10"  (1.778 m)   Wt 68.2 kg (150 lb 5.7 oz)   SpO2 99%   BMI 21.57 kg/m  Alert and oriented to person, not to situation, place, time. Following commands Moving all extremities jp in place  Wound is clean, dry, and without signs of infection.   Intake/Output Summary (Last 24 hours) at 11/05/16 1851 Last data filed at 11/05/16 1800  Gross per 24 hour  Intake             4500 ml  Output             3430 ml  Net             1070 ml   Will watch in unit. Doing well, though still confused.

## 2016-11-05 NOTE — Op Note (Signed)
11/04/2016 - 11/05/2016  12:44 AM  PATIENT:  Jeremy Roach  80 y.o. male  PRE-OPERATIVE DIAGNOSIS:  subdural hematoma, left panhemispheric  POST-OPERATIVE DIAGNOSIS:  subdural hematoma, left panhemispheric PROCEDURE:  Procedure(s):left fronto temporal parietal CRANIOTOMY HEMATOMA EVACUATION SUBDURAL  SURGEON: Surgeon(s): Coletta MemosKyle Raushanah Osmundson, MD  ASSISTANTS:none  ANESTHESIA:   general  EBL:  Total I/O In: 1000 [IV Piggyback:1000] Out: 1250 [Urine:1250]  BLOOD ADMINISTERED:none  COUNT:per nursing  DRAINS: (1) Jackson-Pratt drain(s) with closed bulb suction in the subdural space   SPECIMEN:  No Specimen  DICTATION: Jeremy Roach was taken to the operating room, intubated, and placed under a general anesthetic without difficulty. He was positioned supine on the operating room table. Once adequate anesthesia was obtained I placed a three pin Mayfield headholder. I then secured the head holder to the bed with the Mayfield adaptor. His head was shaved and he was prepped and draped in a sterile manner. I infiltrated his planned scalp incision with 1/2% xylocaine. I opened the scalp incision with a 10 blade. I reflected the scalp and a temporalis muscle flap rostrally. I used Raney clips on the scalp edges. I created 4 burr holes in the fronto temporal parietal skull, then connected them with a craniotome. I elevated the bone flap and placed it aside keeping it moist. I placed tack up sutures around the edge of the opening. I opened the dura with a 15 blade and brownish red thin fluid squirted out under significant pressure. As more of the dura was opened more old blood gushed out. I irrigated the brain copiously until I could not appreciate more clot. I started to close.  I placed a flat Al PimpleJackson Pratt drain in a caudal dependant position. I closed the dura around the drain. I approximated the skull flap with plates and screws. I drilled enough bone so that the drain could be removed easily. I  approximated the temporalis muscle and fascia, then the galea with vicryl sutures. I approximated the scalp edges with staples. I mad a stab incision and with a snap forceps pulled the drain through the scalp. I applied a sterile dressing. He was extubated in the operating room without difficulty.   PLAN OF CARE: Admit to inpatient   PATIENT DISPOSITION:  PACU - hemodynamically stable.   Delay start of Pharmacological VTE agent (>24hrs) due to surgical blood loss or risk of bleeding:  yes

## 2016-11-05 NOTE — Anesthesia Postprocedure Evaluation (Signed)
Anesthesia Post Note  Patient: Jeremy Roach  Procedure(s) Performed: Procedure(s) (LRB): CRANIOTOMY HEMATOMA EVACUATION SUBDURAL (Left)  Patient location during evaluation: PACU Anesthesia Type: General Level of consciousness: awake and alert Pain management: pain level controlled Vital Signs Assessment: post-procedure vital signs reviewed and stable Respiratory status: spontaneous breathing, nonlabored ventilation, respiratory function stable and patient connected to nasal cannula oxygen Cardiovascular status: blood pressure returned to baseline and stable Postop Assessment: no signs of nausea or vomiting Anesthetic complications: no    Last Vitals:  Vitals:   11/04/16 2115 11/05/16 0047  BP: 195/88 (!) (P) 216/156  Pulse: (!) 58 (!) 53  Resp: 14 (P) 10  Temp:  36.3 C    Last Pain:  Vitals:   11/04/16 1440  TempSrc: Rectal                 Dilcia Rybarczyk DAVID

## 2016-11-06 DIAGNOSIS — N320 Bladder-neck obstruction: Secondary | ICD-10-CM

## 2016-11-06 LAB — BASIC METABOLIC PANEL
Anion gap: 9 (ref 5–15)
BUN: 37 mg/dL — AB (ref 6–20)
CALCIUM: 8.3 mg/dL — AB (ref 8.9–10.3)
CO2: 20 mmol/L — AB (ref 22–32)
CREATININE: 3.69 mg/dL — AB (ref 0.61–1.24)
Chloride: 115 mmol/L — ABNORMAL HIGH (ref 101–111)
GFR calc Af Amer: 17 mL/min — ABNORMAL LOW (ref >60)
GFR calc non Af Amer: 14 mL/min — ABNORMAL LOW (ref >60)
GLUCOSE: 125 mg/dL — AB (ref 65–99)
Potassium: 4 mmol/L (ref 3.5–5.1)
Sodium: 144 mmol/L (ref 135–145)

## 2016-11-06 LAB — CBC
HCT: 24 % — ABNORMAL LOW (ref 39.0–52.0)
HEMOGLOBIN: 7.8 g/dL — AB (ref 13.0–17.0)
MCH: 28.9 pg (ref 26.0–34.0)
MCHC: 32.5 g/dL (ref 30.0–36.0)
MCV: 88.9 fL (ref 78.0–100.0)
PLATELETS: 221 10*3/uL (ref 150–400)
RBC: 2.7 MIL/uL — ABNORMAL LOW (ref 4.22–5.81)
RDW: 14.1 % (ref 11.5–15.5)
WBC: 12.5 10*3/uL — ABNORMAL HIGH (ref 4.0–10.5)

## 2016-11-06 NOTE — Progress Notes (Signed)
Patient ID: Jeremy Roach, male   DOB: 01/06/1936, 80 y.o.   MRN: [id number] Subjective: Patient reports no headache or NTW  Objective: Vital signs in last 24 hours: Temp:  [97.3 F (36.3 C)-98.7 F (37.1 C)] 97.3 F (36.3 C) (11/11 0400) Pulse Rate:  [70-86] 80 (11/11 0400) Resp:  [8-20] 13 (11/11 0400) BP: (135-159)/(51-118) 145/57 (11/11 0400) SpO2:  [95 %-100 %] 98 % (11/11 0400) Arterial Line BP: (111-148)/(60-131) 134/131 (11/10 1415) Weight:  [68.2 kg (150 lb 5.7 oz)] 68.2 kg (150 lb 5.7 oz) (11/11 0418)  Intake/Output from previous day: 11/10 0701 - 11/11 0700 In: 2690 [I.V.:2430; IV Piggyback:260] Out: 845 [Urine:600; Drains:245] Intake/Output this shift: Total I/O In: 1075 [I.V.:920; IV Piggyback:155] Out: 60 [Drains:60]  Neurologic: Grossly normal  Lab Results: Lab Results  Component Value Date   WBC 12.5 (H) 11/06/2016   HGB 7.8 (L) 11/06/2016   HCT 24.0 (L) 11/06/2016   MCV 88.9 11/06/2016   PLT 221 11/06/2016   Lab Results  Component Value Date   INR 1.19 11/04/2016   BMET Lab Results  Component Value Date   NA 144 11/06/2016   K 4.0 11/06/2016   CL 115 (H) 11/06/2016   CO2 20 (L) 11/06/2016   GLUCOSE 125 (H) 11/06/2016   BUN 37 (H) 11/06/2016   CREATININE 3.69 (H) 11/06/2016   CALCIUM 8.3 (L) 11/06/2016    Studies/Results: Ct Abdomen Pelvis Wo Contrast  Result Date: 11/04/2016 CLINICAL DATA:  Pain after fall. Abdominal mass. Altered mental status. EXAM: CT ABDOMEN AND PELVIS WITHOUT CONTRAST TECHNIQUE: Multidetector CT imaging of the abdomen and pelvis was performed following the standard protocol without IV contrast. COMPARISON:  None. FINDINGS: Lower chest: The heart is enlarged. Hyperdense appearance of the cardiac walls and septum may be secondary to anemia. Faint calcifications noted in the left ventricle, possibly related to the mitral valve. No pericardial effusion. There is streaky atelectasis and/or scarring at each lung base left greater  than right. No pneumonic consolidations. Tiny nodular density in the right lower lobe laterally consistent with a branch point for pulmonary vessel. Hepatobiliary: Nonspecific hypodensities in the left hepatic lobe the largest is 15 mm seen on series 201, image 13 with Hounsfield unit of 14. Findings are statistically consistent with cysts and/or hemangiomata. The lack of IV contrast limits further assessment. No biliary dilatation is noted. Gallbladder appears physiologically distended without calculus. Pancreas: The pancreas is unremarkable for this unenhanced study. No ductal dilatation is apparent. Spleen: There is no splenomegaly. Adrenals/Urinary Tract: Marked bilateral hydroureteronephrosis with marked distention of the bladder to 22.3 cm craniocaudad by 13 cm AP by 15.2 cm transverse. There is a calcification along the floor of the bladder measuring 12 by 8 by 5 mm possibly at the origin of the prostatic urethra. This may be causing obstruction. Foley catheter decompression is recommended. Neither adrenal gland is well visualized due to lack of oral and IV contrast. Stomach/Bowel: No bowel obstruction or definite inflammation. Stomach is not distended. Moderate amount of stool in the right colon and rectum. Vascular/Lymphatic: Aortoiliac atherosclerosis without aneurysm. No lymphadenopathy. Numerous pelvic phleboliths are seen bilaterally in the lower pelvis. Reproductive: Enlarged prostate with peripheral and central zone calcifications measuring up to 7 cm. Other: No bowel herniation. Trace fluid along the paracolic gutters. Musculoskeletal: No acute osseous abnormality. There is degenerative disc disease at L3-4, L4-5 and L5-S1 with associated mild-to-moderate neural foraminal encroachment at L4-5. IMPRESSION: Marked bilateral hydroureteronephrosis and bladder distention. A 12 x 8 x 5 mm  bladder calcification is seen possibly at the origin of the prostatic urethra which may explain the marked bladder  (volume of 4.4 liters) O and renal collecting system obstruction described. Foley catheter to drainage is recommended. Electronically Signed   By: Tollie Ethavid  Kwon M.D.   On: 11/04/2016 18:36   Dg Chest 2 View  Result Date: 11/04/2016 CLINICAL DATA:  Altered mental status EXAM: CHEST  2 VIEW COMPARISON:  None. FINDINGS: There is no focal parenchymal opacity. There is no pleural effusion or pneumothorax. There is stable cardiomegaly. The osseous structures are unremarkable. IMPRESSION: No active cardiopulmonary disease. Electronically Signed   By: Elige KoHetal  Patel   On: 11/04/2016 15:19   Ct Head Wo Contrast  Result Date: 11/04/2016 CLINICAL DATA:  Altered mental status with history of fall EXAM: CT HEAD WITHOUT CONTRAST TECHNIQUE: Contiguous axial images were obtained from the base of the skull through the vertex without intravenous contrast. COMPARISON:  None. 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 This is larger on the left side, and measures at least 2.5 cm in thickness. There is approximately 12 mm of midline shift to the right. Right-sided subdural hematoma measures 1.2 cm in maximum thickness along the right parietal convexity on coronal views. There is inter hemispheric extension of subdural blood anteriorly and posteriorly, right inter hemispheric subdural hematoma measures 8 mm in thickness. There is additional subdural blood along the right tentorium. There is compression and mass effect upon the lateral ventricles with asymmetric dilatation of the right atrium. Fourth ventricle is small but patent. There is mild effacement of the pre pontine and suprasellar cisterns. Diffuse bilateral brain swelling. There is no transtentorial herniation. Sagittal views demonstrate a possible 4 mm hypodense nodule within the posterior aspect of the pituitary gland. There is no focal mass. Vascular: No hyperdense vessels.  Carotid artery calcifications. Skull: No obvious  fracture.  The mastoid air cells are clear. Sinuses/Orbits: Minimal mucosal thickening in the ethmoid sinuses. The globes appear intact. Other: None IMPRESSION: Large bilateral left greater than right acute on chronic subdural hematoma as with approximately 12 mm of midline shift to the right. There is inter hemispheric subdural hematoma as well as right tentorial subdural. There is significant mass effect on the lateral ventricles. Fourth ventricle is patent. There is mild effacement of the basilar cisterns with diffuse brain swelling present. Possible 4 mm hypodense nodule in the posterior aspect of the pituitary gland. Eventual MRI evaluation may be considered Critical Value/emergent results were called by telephone at the time of interpretation on 11/04/2016 at 6:37 pm to Dr. Erma HeritageIsaacs , who verbally acknowledged these results. Electronically Signed   By: Jasmine PangKim  Fujinaga M.D.   On: 11/04/2016 18:37   Dg Chest Port 1 View  Result Date: 11/05/2016 CLINICAL DATA:  Postop and craniotomy with subdural hematoma evacuation. EXAM: PORTABLE CHEST 1 VIEW COMPARISON:  None. FINDINGS: Cardiac enlargement without vascular congestion. No focal lung consolidation or edema. No blunting of costophrenic angles. No pneumothorax. Mediastinal contours appear intact. Degenerative changes in the spine and shoulders. IMPRESSION: Cardiac enlargement.  No evidence of active pulmonary disease. Electronically Signed   By: Burman NievesWilliam  Stevens M.D.   On: 11/05/2016 02:11    Assessment/Plan: Doing well, mobilize  LOS: 2 days    Norvil Martensen S 11/06/2016, 5:30 AM

## 2016-11-06 NOTE — Evaluation (Signed)
Occupational Therapy Evaluation Patient Details Name: Jeremy Roach MRN: [id number] DOB: 11/15/1936 Today's Date: 11/06/2016    History of Present Illness 80 y/o with progressive altered mental status.  Found to have acute on chronic b/l SDH, renal failure with b/l hydroureteronephrosis.  Reported to have 30 lbs weight loss.   Clinical Impression   Pt admitted with the above diagnoses and presents with below problem list. Pt will benefit from continued acute OT to address the below listed deficits and maximize independence with basic ADLs prior to d/c to venue below. PTA pt reports he was independent with ADLs. Pt is currently min guard with LB ADLs and in-room functional mobility, min A with tub shower transfer. No family present at time of eval to compare baseline cognition with current cognitive deficits. Pt with decreased insight into deficits, decreased safety awareness, impulsive at times, and decreased oriention to situation. Tangential speech and confused. Session details below.    Follow Up Recommendations  Supervision/Assistance - 24 hour;Home health OT; if family cannot provide 24 hour assist then may need to consider [facility] for rehab. Wife is physically disabled per pt report.   Equipment Recommendations  None recommended by OT    Recommendations for Other Services Speech consult (for cognitive assessment)     Precautions / Restrictions Precautions Precautions: Fall Restrictions Weight Bearing Restrictions: No      Mobility Bed Mobility               General bed mobility comments: up in chair  Transfers Overall transfer level: Needs assistance Equipment used: Rolling walker (2 wheeled);None Transfers: Sit to/from Stand Sit to Stand: Min guard         General transfer comment: from recliner initially with rw, then with no AD "I don't need this thing."; min guard for safety    Balance Overall balance assessment: Needs assistance         Standing  balance support: No upper extremity supported;During functional activity Standing balance-Leahy Scale: Fair Standing balance comment: stood at sink to brush teeth with intermittent support of sink counter. No LOB.                            ADL Overall ADL's : Needs assistance/impaired Eating/Feeding: Set up;Sitting   Grooming: Oral care;Min guard;Set up;Wash/dry hands;Standing   Upper Body Bathing: Set up;Sitting;Cueing for safety   Lower Body Bathing: Min guard;Cueing for safety;Sit to/from stand   Upper Body Dressing : Set up;Sitting;Cueing for safety   Lower Body Dressing: Min guard;Cueing for safety;Sit to/from stand   Toilet Transfer: Min Nurse, learning disabilityguard;BSC;RW Toilet Transfer Details (indicate cue type and reason): transfers completed with and without rw Toileting- ArchitectClothing Manipulation and Hygiene: Min guard;Cueing for safety;Sit to/from stand   Tub/ Shower Transfer: Min guard;Minimal assistance;Tub transfer;Cueing for safety;3 in 1   Functional mobility during ADLs: Min guard General ADL Comments: Pt completed in-room functional mobility initially with rw until pt lifted rw off the ground and was walking without it, then pt ambulating min guard with no AD. Pt completed grooming tasks standing at sink. Decreased safety awareness and insight into deficits.      Vision Additional Comments: able to read words on therapist name badge with no reported or observed difficulty.    Perception     Praxis      Pertinent Vitals/Pain Pain Assessment: No/denies pain     Hand Dominance     Extremity/Trunk Assessment Upper Extremity Assessment Upper  Extremity Assessment: Overall WFL for tasks assessed   Lower Extremity Assessment Lower Extremity Assessment: Defer to PT evaluation       Communication Communication Communication: No difficulties;Other (comment) (tangental speech)   Cognition Arousal/Alertness: Awake/alert Behavior During Therapy: Impulsive Overall  Cognitive Status: No family/caregiver present to determine baseline cognitive functioning       Memory: Decreased recall of precautions;Decreased short-term memory             General Comments    Pt reports his wife has health issues ("stroke in 2003") and has limited mobility using a rw around the house, w/c for distance. Pt reports he has 2 children, one in [place] and 1 who "lives locally but she has a son and a husband." Pt reports family is trying to arrange assistance at home at d/c.      Exercises       Shoulder Instructions      Home Living Family/patient expects to be discharged to:: Private residence Living Arrangements: Spouse/significant other Available Help at Discharge: Family;Available 24 hours/day Type of Home: House Home Access: Stairs to enter Entrance Stairs-Number of Steps: 10   Home Layout: Two level;Other (Comment) (split level) Alternate Level Stairs-Number of Steps: 6 stair steps from front door to main floor   Bathroom Shower/Tub: Tub/shower unit         Home Equipment: Shower seat;Bedside commode          Prior Functioning/Environment Level of Independence: Independent        Comments: per pt he was independent with ADLs, no AD used for ambulation at baseline        OT Problem List: Impaired balance (sitting and/or standing);Decreased knowledge of use of DME or AE;Decreased knowledge of precautions;Pain;Decreased cognition;Decreased safety awareness   OT Treatment/Interventions: Self-care/ADL training;DME and/or AE instruction;Therapeutic activities;Cognitive remediation/compensation;Patient/family education;Balance training    OT Goals(Current goals can be found in the care plan section) Acute Rehab OT Goals Patient Stated Goal: home OT Goal Formulation: With patient Time For Goal Achievement: 11/20/16 Potential to Achieve Goals: Good ADL Goals Pt Will Perform Grooming: with modified independence;sitting;standing (full grooming  session (2-3 tasks)) Pt Will Perform Upper Body Bathing: with modified independence;sitting Pt Will Perform Lower Body Bathing: with modified independence;sit to/from stand Pt Will Perform Upper Body Dressing: with modified independence;sitting Pt Will Perform Lower Body Dressing: with modified independence;sit to/from stand  OT Frequency: Min 2X/week   Barriers to D/C: Decreased caregiver support;Other (comment)  Pt reports his wife has health issues ("stroke in 2003") and has limited mobility using a rw around the house, w/c for distance. Pt reports he has 2 children, one in [place] and 1 who "lives locally but she has a son and a husband." Pt reports family is trying to arrange assistance at home at d/c.       Co-evaluation              End of Session Equipment Utilized During Treatment: Gait belt;Other (comment) (initially used rw, then no AD) Nurse Communication: Mobility status;Other (comment) (cognition, d/c plan)  Activity Tolerance: Patient tolerated treatment well Patient left: in chair;with call bell/phone within reach;with chair alarm set   Time: [id number] OT Time Calculation (min): 28 min Charges:  OT General Charges $OT Visit: 1 Procedure OT Evaluation $OT Eval Low Complexity: 1 Procedure OT Treatments $Self Care/Home Management : 8-22 mins G-Codes:    Pilar GrammesMathews, Lizabeth Fellner H 11/06/2016, 4:04 PM

## 2016-11-06 NOTE — Progress Notes (Signed)
Patient ID: Jeremy Roach, male   DOB: 05/13/1936, 80 y.o.   MRN: [id number]  2 Days Post-Op Subjective: Pt resting comfortably.  Urethral catheter in place.  Objective: Vital signs in last 24 hours: Temp:  [97.3 F (36.3 C)-98.7 F (37.1 C)] 97.3 F (36.3 C) (11/11 0400) Pulse Rate:  [70-93] 76 (11/11 0700) Resp:  [8-20] 12 (11/11 0700) BP: (135-159)/(51-81) 158/81 (11/11 0700) SpO2:  [95 %-100 %] 99 % (11/11 0700) Arterial Line BP: (111-148)/(64-131) 134/131 (11/10 1415) Weight:  [68.2 kg (150 lb 5.7 oz)] 68.2 kg (150 lb 5.7 oz) (11/11 0418)  Intake/Output from previous day: 11/10 0701 - 11/11 0700 In: 2890 [I.V.:2630; IV Piggyback:260] Out: 1455 [Urine:1150; Drains:305] Intake/Output this shift: No intake/output data recorded.  Physical Exam:  General: Alert and oriented GU: Catheter draining pink tinged urine  Lab Results:  Recent Labs  11/04/16 1600 11/05/16 0440 11/06/16 0220  HGB 9.0* 8.0* 7.8*  HCT 27.6* 24.8* 24.0*   BMET  Recent Labs  11/05/16 0440 11/06/16 0220  NA 142 144  K 4.3 4.0  CL 115* 115*  CO2 21* 20*  GLUCOSE 127* 125*  BUN 33* 37*  CREATININE 3.25* 3.69*  CALCIUM 8.1* 8.3*     Studies/Results:   Assessment/Plan: Urinary retention with bilateral hydroureteronephrosis: Continue urethral catheter drainage.  Continue to monitor renal function and for postobstructive diuresis.  His baseline renal function is unknown and he likely has chronic kidney disease due to long standing obstruction.  I discussed the patient's situation with him in more detail today.  He is adamant that he will refuse any urologic intervention or continued urethral catheterization beyond his hospitalization.  He understands that removal of his catheter and failure to address his urinary obstruction will undoubtedly result in progressive renal failure that could lead to death.  He expressed his understanding but still adamantly refuses any intervention.  While my  recommendation would be to proceed repeat renal imaging in about 48-72 hours to evaluate for resolution of hydronephrosis and then to continue Foley catheter drainage with outpatient evaluation and possible further urologic intervention/surgery in the outpatient setting, I am unsure further evaluation is warranted if he truly will refuse recommended medical care.  I assume he is capable of making appropriate medical decisions despite his head injury but will rely on his primary team to assess his capability.  I will continue to follow him.   LOS: 2 days   Jessee Mezera,LES 11/06/2016, 8:01 AM

## 2016-11-06 NOTE — Progress Notes (Signed)
PULMONARY / CRITICAL CARE MEDICINE   Name: Jeremy Roach MRN: [id number] DOB: 09/01/1936    ADMISSION DATE:  11/04/2016  REFERRING MD:  Dr. Franky Machoabbell  CHIEF COMPLAINT:  Lethargy  HISTORY OF PRESENT ILLNESS:   80 y/o with progressive altered mental status.  Found to have acute on chronic b/l SDH, renal failure with b/l hydroureteronephrosis.  Reported to have 30 lbs weight loss.  SUBJECTIVE:  Pt with multiple interesting stories > discussing perception / reality, "Jeremy Roach", the disciples interpretations, etc.  Denies pain / SOB  VITAL SIGNS: BP (!) 170/77 (BP Location: Left Arm)   Pulse 79   Temp 98.8 F (37.1 C) (Oral)   Resp 15   Ht 5\' 10"  (1.778 m)   Wt 150 lb 5.7 oz (68.2 kg)   SpO2 97%   BMI 21.57 kg/m   INTAKE / OUTPUT: I/O last 3 completed shifts: In: 5905 [I.V.:4490; IV Piggyback:1415] Out: 3700 [Urine:2975; Drains:650; Blood:75]  PHYSICAL EXAMINATION: General: thin Neuro: Alert, follows commands, pleasant confusion, easily reoriented HEENT: surgical scar with staples on L scalp Cardiovascular: regular, no murmur Lungs: no wheeze Abdomen: soft, non tender Musculoskeletal: no edema Skin: no rashes  LABS:  BMET  Recent Labs Lab 11/04/16 1600 11/05/16 0440 11/06/16 0220  NA 141 142 144  K 4.4 4.3 4.0  CL 110 115* 115*  CO2 20* 21* 20*  BUN 31* 33* 37*  CREATININE 3.51* 3.25* 3.69*  GLUCOSE 106* 127* 125*    Electrolytes  Recent Labs Lab 11/04/16 1600 11/05/16 0440 11/06/16 0220  CALCIUM 9.1 8.1* 8.3*  MG  --  1.8  --   PHOS  --  4.4  --     CBC  Recent Labs Lab 11/04/16 1600 11/05/16 0440 11/06/16 0220  WBC 7.6 10.1 12.5*  HGB 9.0* 8.0* 7.8*  HCT 27.6* 24.8* 24.0*  PLT 276 255 221    Coag's  Recent Labs Lab 11/04/16 1945  APTT 27  INR 1.19    Sepsis Markers No results for input(s): LATICACIDVEN, PROCALCITON, O2SATVEN in the last 168 hours.  ABG No results for input(s): PHART, PCO2ART, PO2ART in the last 168  hours.  Liver Enzymes  Recent Labs Lab 11/04/16 1600  AST 21  ALT 12*  ALKPHOS 54  BILITOT 0.6  ALBUMIN 4.1    Cardiac Enzymes No results for input(s): TROPONINI, PROBNP in the last 168 hours.  Glucose No results for input(s): GLUCAP in the last 168 hours.  Imaging No results found.   STUDIES:  CT head 11/9 > Large bilateral left greater than right acute on chronic subdural hematoma as with approximately 12 mm of midline shift to the right. There is inter hemispheric subdural hematoma as well as right tentorial subdural. There is significant mass effect on the lateral ventricles. Fourth ventricle is patent. There is mild effacement of the basilar cisterns with diffuse brain swelling present. Possible 4 mm hypodense nodule in the posterior aspect of the pituitary gland. Eventual MRI evaluation may be considered. CT abdomen 11/9 > Marked bilateral hydroureteronephrosis and bladder distention. A 12 x 8 x 5 mm bladder calcification is seen possibly at the origin of the prostatic urethra which may explain the marked bladder (volume of 4.4 liters) O and renal collecting system obstruction described. Foley catheter to drainage is recommended.  CULTURES: Urine Cx 11/9 > not done  ANTIBIOTICS: Ceftriaxone 11/9 >  SIGNIFICANT EVENTS: 11/09 admit, to OR for SDH evac 11/11 to med-surg  LINES/TUBES: Brain drain 11/09 >> 11/10 Rt radial  aline 11/09 >> out  DISCUSSION: 80 year old male admitted 11/9 with SDH after fall 2 weeks ago. Also with bladder outlet obstruction and bilateral hydro.   ASSESSMENT / PLAN:  Acute encephalopathy 2nd to acute on chronic SDH s/p evacuation 11/09. - post op care, AEDs per neurosurgery  Acute delirium ?if he has hx of ETOH. - d/c CIWA, not used - D/C thiamine, folic acid,  - continue MVI  HTN emergency. - monitor BP trend  B/l hydroureteronephrosis with bladder calcification at origin of prostatic urethra. AKI - uncertain baseline renal  function. - continue foley - Urology following, appreciate input  UTI Hematuria - Day 3 of rocephin  Anemia of critical illness. - f/u CBC  Moderate protein calorie malnutrition. - Diet as tolerated   DVT prophylaxis - SCDs SUP - protonix Goal of care - Full code   GLOBAL: transfer to primary SVC to TRH as of 11/12 am.  PCCM will sign off.   Jeremy BrimBrandi Arden Axon, NP-C [facility] Pgr: [phone] or if no answer [phone] 11/06/2016, 8:34 AM

## 2016-11-06 NOTE — Evaluation (Signed)
Physical Therapy Evaluation Patient Details Name: Jeremy CanavanBobby Dufault MRN: [id number] DOB: 02/28/1936 Today's Date: 11/06/2016   History of Present Illness  Patient is a 80 y/o male admitted 11/04/16 with progressive altered mental status following fall 2 weeks pta.  Found to have acute on chronic b/l SDH, now s/p craniotomy for evacuation of SDH.  Patient also with renal failure with b/l hydroureteronephrosis, UTI, HTN, anemia..      Clinical Impression  Patient presents with problems listed below.  Will benefit from acute PT to maximize functional independence prior to d/c home if family can provide 24 hour assist.  Recommend HHPT f/u.  If unable to provide 24 hour assist, may need to consider SNF at d/c.    Follow Up Recommendations Home health PT;Supervision/Assistance - 24 hour  (If unable to provide 24 hour assist, may need SNF)    Equipment Recommendations  None recommended by PT    Recommendations for Other Services       Precautions / Restrictions Precautions Precautions: Fall Restrictions Weight Bearing Restrictions: No      Mobility  Bed Mobility               General bed mobility comments: Patient in chair  Transfers Overall transfer level: Needs assistance Equipment used: None Transfers: Sit to/from Stand Sit to Stand: Supervision         General transfer comment: Supervision for safety only.  Good static balance in stance.  Ambulation/Gait Ambulation/Gait assistance: Min guard Ambulation Distance (Feet): 160 Feet Assistive device: None Gait Pattern/deviations: Step-through pattern;Decreased step length - right;Decreased step length - left;Decreased stride length;Drifts right/left;Narrow base of support Gait velocity: decreased Gait velocity interpretation: Below normal speed for age/gender General Gait Details: Patient with slow, fairly steady gait with no assistive device.  Short step lengths with narrow base of support.  Assist for safety/balance  only.  Stairs            Wheelchair Mobility    Modified Rankin (Stroke Patients Only)       Balance Overall balance assessment: Needs assistance         Standing balance support: No upper extremity supported Standing balance-Leahy Scale: Good Standing balance comment: Able to ambulate with no loss of balance.                               Pertinent Vitals/Pain Pain Assessment: No/denies pain    Home Living Family/patient expects to be discharged to:: Private residence Living Arrangements: Spouse/significant other Available Help at Discharge: Family;Available 24 hours/day Type of Home: House Home Access: Stairs to enter Entrance Stairs-Rails: Doctor, general practiceight;Left Entrance Stairs-Number of Steps: 10 Home Layout: Two level Home Equipment: Shower seat;Bedside commode;Walker - 2 wheels;Cane - single point Additional Comments: Per patient, he assists wife at home.    Prior Function Level of Independence: Independent         Comments: per pt he was independent with ADLs, no AD used for ambulation at baseline.  Patient drives.     Hand Dominance        Extremity/Trunk Assessment   Upper Extremity Assessment: Overall WFL for tasks assessed           Lower Extremity Assessment: Generalized weakness      Cervical / Trunk Assessment: Normal  Communication   Communication: No difficulties  Cognition Arousal/Alertness: Awake/alert Behavior During Therapy: Impulsive;Anxious Overall Cognitive Status: No family/caregiver present to determine baseline cognitive functioning (Oriented x3.  Difficulty  with situation) Area of Impairment: Orientation;Attention;Memory;Awareness Orientation Level: Disoriented to;Situation Current Attention Level: Sustained Memory: Decreased recall of precautions;Decreased short-term memory         General Comments: Patient unable to recall why he is in hospital.  "They told me they were giving me something to relax before I  got into the ambulance, and then I woke up and this (craniotomy stitches) was on my head."  Repeated cueing to stay on task.     General Comments General comments (skin integrity, edema, etc.): Pt oriented to person, place, and time. Pt is confused on details of situation. Reports he injured his head with a crowbar a couple of weeks ago "and I was doing alright but I'm here because my wife likes things done a certain way." "I could have taken care of that (gesturing to incision in the mirror), I get bumps on my head all the time and they go away on their own." "How many stairs to enter my house? You can go online and type in my address and see it."     Exercises     Assessment/Plan    PT Assessment Patient needs continued PT services  PT Problem List Decreased strength;Decreased balance;Decreased mobility;Decreased cognition          PT Treatment Interventions DME instruction;Gait training;Stair training;Functional mobility training;Therapeutic activities;Balance training;Therapeutic exercise;Cognitive remediation;Patient/family education    PT Goals (Current goals can be found in the Care Plan section)  Acute Rehab PT Goals Patient Stated Goal: home PT Goal Formulation: With patient Time For Goal Achievement: 11/13/16 Potential to Achieve Goals: Good    Frequency Min 3X/week   Barriers to discharge   Unsure of support available at home.    Co-evaluation               End of Session Equipment Utilized During Treatment: Gait belt Activity Tolerance: Patient tolerated treatment well Patient left: in chair;with call bell/phone within reach;with chair alarm set Nurse Communication: Mobility status         Time: [id number] PT Time Calculation (min) (ACUTE ONLY): 23 min   Charges:   PT Evaluation $PT Eval Moderate Complexity: 1 Procedure PT Treatments $Gait Training: 8-22 mins   PT G Codes:        Vena AustriaDavis, Tashara Suder H 11/06/2016, 6:23 PM Durenda HurtSusan H. Renaldo Fiddleravis, PT,  [facility] Acute Rehab Services Pager [phone]

## 2016-11-06 NOTE — Progress Notes (Signed)
Patient arrived to unit 0915.  Alert, oriented to self, place, time.  No complaints of pain or discomfort.  Resting in bed with HOB elevated.  Oriented to room, call light.

## 2016-11-07 ENCOUNTER — Inpatient Hospital Stay (HOSPITAL_COMMUNITY): Payer: Medicare Other

## 2016-11-07 DIAGNOSIS — I517 Cardiomegaly: Secondary | ICD-10-CM

## 2016-11-07 LAB — VITAMIN B12: Vitamin B-12: 852 pg/mL (ref 180–914)

## 2016-11-07 LAB — BASIC METABOLIC PANEL
ANION GAP: 8 (ref 5–15)
BUN: 48 mg/dL — AB (ref 6–20)
CHLORIDE: 115 mmol/L — AB (ref 101–111)
CO2: 17 mmol/L — AB (ref 22–32)
Calcium: 8.1 mg/dL — ABNORMAL LOW (ref 8.9–10.3)
Creatinine, Ser: 3.92 mg/dL — ABNORMAL HIGH (ref 0.61–1.24)
GFR calc Af Amer: 16 mL/min — ABNORMAL LOW (ref >60)
GFR calc non Af Amer: 13 mL/min — ABNORMAL LOW (ref >60)
GLUCOSE: 118 mg/dL — AB (ref 65–99)
POTASSIUM: 4.3 mmol/L (ref 3.5–5.1)
Sodium: 140 mmol/L (ref 135–145)

## 2016-11-07 LAB — URINALYSIS W MICROSCOPIC (NOT AT ARMC)
Bacteria, UA: NONE SEEN
Bilirubin Urine: NEGATIVE
GLUCOSE, UA: NEGATIVE mg/dL
KETONES UR: NEGATIVE mg/dL
Nitrite: NEGATIVE
PROTEIN: 30 mg/dL — AB
Specific Gravity, Urine: 1.01 (ref 1.005–1.030)
pH: 6.5 (ref 5.0–8.0)

## 2016-11-07 LAB — IRON AND TIBC
Iron: 12 ug/dL — ABNORMAL LOW (ref 45–182)
Saturation Ratios: 6 % — ABNORMAL LOW (ref 17.9–39.5)
TIBC: 190 ug/dL — AB (ref 250–450)
UIBC: 178 ug/dL

## 2016-11-07 LAB — SODIUM, URINE, RANDOM: Sodium, Ur: 99 mmol/L

## 2016-11-07 LAB — CBC
HCT: 23.5 % — ABNORMAL LOW (ref 39.0–52.0)
HEMATOCRIT: 22.6 % — AB (ref 39.0–52.0)
HEMOGLOBIN: 7.5 g/dL — AB (ref 13.0–17.0)
Hemoglobin: 7.7 g/dL — ABNORMAL LOW (ref 13.0–17.0)
MCH: 29.3 pg (ref 26.0–34.0)
MCH: 28.8 pg (ref 26.0–34.0)
MCHC: 33.2 g/dL (ref 30.0–36.0)
MCHC: 32.8 g/dL (ref 30.0–36.0)
MCV: 88.3 fL (ref 78.0–100.0)
MCV: 88 fL (ref 78.0–100.0)
PLATELETS: 215 10*3/uL (ref 150–400)
Platelets: 199 10*3/uL (ref 150–400)
RBC: 2.56 MIL/uL — ABNORMAL LOW (ref 4.22–5.81)
RBC: 2.67 MIL/uL — AB (ref 4.22–5.81)
RDW: 14.6 % (ref 11.5–15.5)
RDW: 14.5 % (ref 11.5–15.5)
WBC: 11.3 10*3/uL — ABNORMAL HIGH (ref 4.0–10.5)
WBC: 10 10*3/uL (ref 4.0–10.5)

## 2016-11-07 LAB — FERRITIN: FERRITIN: 139 ng/mL (ref 24–336)

## 2016-11-07 LAB — RETICULOCYTES
RBC.: 3.2 MIL/uL — AB (ref 4.22–5.81)
Retic Count, Absolute: 22.4 10*3/uL (ref 19.0–186.0)
Retic Ct Pct: 0.7 % (ref 0.4–3.1)

## 2016-11-07 LAB — FOLATE: FOLATE: 13.7 ng/mL (ref >5.9)

## 2016-11-07 LAB — ECHOCARDIOGRAM COMPLETE
Height: 70 in
Weight: 2405.66 oz

## 2016-11-07 LAB — OSMOLALITY: Osmolality: 320 mOsm/kg — ABNORMAL HIGH (ref 275–295)

## 2016-11-07 LAB — OSMOLALITY, URINE: Osmolality, Ur: 377 mOsm/kg (ref 300–900)

## 2016-11-07 LAB — CREATININE, URINE, RANDOM: CREATININE, URINE: 65.42 mg/dL

## 2016-11-07 LAB — ABO/RH: ABO/RH(D): B POS

## 2016-11-07 LAB — PROTIME-INR
INR: 1.13
PROTHROMBIN TIME: 14.5 s (ref 11.4–15.2)

## 2016-11-07 LAB — LACTATE DEHYDROGENASE: LDH: 198 U/L — ABNORMAL HIGH (ref 98–192)

## 2016-11-07 MED ORDER — HYDRALAZINE HCL 20 MG/ML IJ SOLN
5.0000 mg | Freq: Once | INTRAMUSCULAR | Status: AC
Start: 2016-11-07 — End: 2016-11-07
  Administered 2016-11-07: 5 mg via INTRAVENOUS
  Filled 2016-11-07: qty 1

## 2016-11-07 MED ORDER — POLYETHYLENE GLYCOL 3350 17 G PO PACK
17.0000 g | PACK | Freq: Every day | ORAL | Status: DC
  Filled 2016-11-07 (×2): qty 1

## 2016-11-07 MED ORDER — ACETAMINOPHEN 325 MG PO TABS: 650.0000 mg | ORAL_TABLET | Freq: Four times a day (QID) | ORAL | Status: DC | PRN

## 2016-11-07 MED ORDER — FERROUS SULFATE 325 (65 FE) MG PO TABS
325.0000 mg | ORAL_TABLET | Freq: Three times a day (TID) | ORAL | Status: DC
  Administered 2016-11-07 – 2016-11-09 (×6): 325 mg via ORAL
  Filled 2016-11-07 (×5): qty 1

## 2016-11-07 MED ORDER — AMLODIPINE BESYLATE 5 MG PO TABS
5.0000 mg | ORAL_TABLET | Freq: Every day | ORAL | Status: DC
  Administered 2016-11-09: 5 mg via ORAL
  Filled 2016-11-07 (×3): qty 1

## 2016-11-07 NOTE — Progress Notes (Signed)
Patient ID: Jeremy CanavanBobby Roach, male   DOB: 05/01/1936, 80 y.o.   MRN: [id number]  3 Days Post-Op Subjective: Pt without new complaints.  He remains adamant that he wants to avoid any further evaluation or treatment of his retention and renal dysfunction.  Denies pain.  Objective: Vital signs in last 24 hours: Temp:  [97.7 F (36.5 C)-98.2 F (36.8 C)] 98 F (36.7 C) (11/12 0513) Pulse Rate:  [78-84] 83 (11/12 0513) Resp:  [20] 20 (11/12 0513) BP: (178-195)/(77-92) 190/92 (11/12 0513) SpO2:  [94 %-100 %] 100 % (11/12 0513)  Intake/Output from previous day: 11/11 0701 - 11/12 0700 In: 500 [I.V.:500] Out: 950 [Urine:950] Intake/Output this shift: No intake/output data recorded.  Physical Exam:  General: Alert and oriented GU: Catheter draining clear urine  Lab Results:  Recent Labs  11/05/16 0440 11/06/16 0220 11/07/16 0230  HGB 8.0* 7.8* 7.5*  HCT 24.8* 24.0* 22.6*   BMET  Recent Labs  11/06/16 0220 11/07/16 0230  NA 144 140  K 4.0 4.3  CL 115* 115*  CO2 20* 17*  GLUCOSE 125* 118*  BUN 37* 48*  CREATININE 3.69* 3.92*  CALCIUM 8.3* 8.1*     Studies/Results: No results found.  Assessment/Plan: 1) Acute kidney injury vs chronic kidney disease: Baseline Cr unknown.  Renal function not improving after catheter drainage indicating either chronicity of renal dysfunction or persistent upper tract urinary obstruction or other medical cause of AKI. My recommendation would be to recheck renal ultrasound tomorrow if renal function still not improving and to consider renal drainage (percutaneous nephrostomy vs ureteral stent drainage) if hydronephrosis not improving.  Would also consider nephrology consult if renal function not improving.  However, patient still adamantly refuses any further urologic (or other medical) intervention at this time or in the future. I have reiterated that refusing further urologic evaluation or intervention will almost assuredly lead to progressive  renal failure and death and risks severe urinary tract infection.  He expressed his understanding but states that he still wishes to avoid any ongoing care or even continued bladder drainage with catheterization. I will rely on the primary team to assess the patients capability to make appropriate medical decisions although he seems to act very appropriate and demonstrate a very clear understanding of his options and the consequences of his choices.  He understands he would be leaving against medical advice if he chooses to have the catheter removed and avoid further evaluation/treatment.  I would be happy to talk with his family or other designated contacts about his medical situation if he allows permission to do so.   LOS: 3 days   Krystle Polcyn,LES 11/07/2016, 9:40 AM

## 2016-11-07 NOTE — Progress Notes (Signed)
eLink Physician-Brief Progress Note Patient Name: Jeremy CanavanBobby Fedak DOB: 09/23/1936 MRN: [id number]   Date of Service  11/07/2016  HPI/Events of Note  Hypertension  eICU Interventions  Hydralazine 5 mg IVx1 if SBP>170.     Intervention Category Major Interventions: Hypertension - evaluation and management  YACOUB,WESAM 11/07/2016, 4:43 AM

## 2016-11-07 NOTE — Progress Notes (Signed)
Patient ID: Jeremy Roach, male   DOB: 11/26/1936, 80 y.o.   MRN: [id number] Looks good today. Incision dry and intact. He is awake and pleasant and conversant. Moves all extremities well. I had a long discussion him regarding Dr. Vevelyn RoyalsBorden's recommendations. He has decided to listen once more to the recommendations to try to make a decision.

## 2016-11-07 NOTE — Progress Notes (Signed)
[facility] Progress Note  Patient: Jeremy Roach ZOX:[id number]   PCP: No PCP Per Patient DOB: 04/08/1936   DOA: 11/04/2016   DOS: 11/07/2016   Date of Service: the patient was seen and examined on 11/07/2016  Brief hospital course: Pt. with No significant PMH; admitted on 11/04/2016, with complaint of lethargy, was found to have subdural hematoma with midline shift. He underwent urgent decompression surgery and was in the ICU and transferred to MedSurg today. Currently further plan is continue further workup for renal dysfunction, control blood pressure.  Assessment and Plan: 1. Subdural hematoma S/P urgent evacuation left hemispheric mixed density subdural hematoma. Tolerated procedure well. No focal deficit on examination. Neurosurgery recommends continue current management.  2. Hypertensive urgency. Blood pressure significantly elevated. Unlikely the cause of patient's SDH. Discussed with neurosurgery, blood pressure control is recommended per normal medical parameters. I would initiate amlodipine today. Use when necessary hydralazine. Goal systolic 140-160   3. Acute kidney injury, suspecting large component of it being chronic. Bilateral hydronephrosis. Urinary retention. Has been fully catheter placement. Urology consult. Appreciate input Renal function progressively worsening. Likely due to poor control of the blood pressure as well as persistent obstruction. We discussed with urology as patient initially refused any surgical intervention but now agreeing to it. If the renal function continues to worsen tomorrow we'll discuss with nephrology as well.  4. Normocytic anemia. Iron level low, B-12 normal, folic acid normal, retic normal, no external bleeding. Initiate iron supplementation.  5. UTI.  patient started on IV ceftriaxone. Recheck culture. Complete 5 treatment.  6. Seizure prophylaxis. Continue Keppra.  Pain management: When necessary Tylenol, when  necessary Norco Activity: Consulted physical therapy Bowel regimen: last BM prior to admission, bowel regimen initiated Diet: Cardiac diet DVT Prophylaxis: mechanical compression device.  Advance goals of care discussion: Full code  Family Communication: family was present at bedside, at the time of interview. The pt provided permission to discuss medical plan with the family. Opportunity was given to ask question and all questions were answered satisfactorily.   Disposition:  Discharge to home. Expected discharge date: 11/10/2016, stabilization of the renal function  Consultants: Urology, neurosurgery, CCM prior admission Procedures: S/P craniotomy  Antibiotics: Anti-infectives    Start     Dose/Rate Route Frequency Ordered Stop   11/04/16 2300  cefTRIAXone (ROCEPHIN) 2 g in dextrose 5 % 50 mL IVPB     2 g 100 mL/hr over 30 Minutes Intravenous Every 24 hours 11/04/16 2215          Subjective: Feeling better, denies any acute complaint  Objective: Physical Exam: Vitals:   11/07/16 0147 11/07/16 0513 11/07/16 0950 11/07/16 1413  BP: (!) 195/82 (!) 190/92 (!) 168/77 (!) 166/97  Pulse: 79 83 78 86  Resp: 20 20 16 16   Temp: 97.7 F (36.5 C) 98 F (36.7 C) 98 F (36.7 C) 98.5 F (36.9 C)  TempSrc: Oral Oral Oral Oral  SpO2: 100% 100% 100% 100%  Weight:      Height:        Intake/Output Summary (Last 24 hours) at 11/07/16 1635 Last data filed at 11/06/16 1901  Gross per 24 hour  Intake              500 ml  Output              700 ml  Net             -200 ml   Filed Weights   11/05/16 0218 11/06/16  0418  Weight: 68.2 kg (150 lb 5.7 oz) 68.2 kg (150 lb 5.7 oz)    General: Alert, Awake and Oriented to Time, Place and Person. Appear in  distress, affect appropriate Eyes: PERRL, Conjunctiva normal ENT: Oral Mucosa clear moist. Neck: no JVD, no Abnormal Mass Or lumps Cardiovascular: S1 and S2 Present, no Murmur, Respiratory: Bilateral Air entry equal and Decreased,  no use of accessory muscle, Clear to Auscultation, no Crackles, no wheezes Abdomen: Bowel Sound present, Soft and no tenderness Skin: no redness, no Rash, no induration Extremities: no Pedal edema, no calf tenderness Neurologic: Grossly no focal neuro deficit. Bilaterally Equal motor strength  Data Reviewed: CBC:  Recent Labs Lab 11/04/16 1600 11/05/16 0440 11/06/16 0220 11/07/16 0230 11/07/16 1352  WBC 7.6 10.1 12.5* 11.3* 10.0  NEUTROABS 6.3  --   --   --   --   HGB 9.0* 8.0* 7.8* 7.5* 7.7*  HCT 27.6* 24.8* 24.0* 22.6* 23.5*  MCV 89.6 89.2 88.9 88.3 88.0  PLT 276 255 221 199 215   Basic Metabolic Panel:  Recent Labs Lab 11/04/16 1600 11/05/16 0440 11/06/16 0220 11/07/16 0230  NA 141 142 144 140  K 4.4 4.3 4.0 4.3  CL 110 115* 115* 115*  CO2 20* 21* 20* 17*  GLUCOSE 106* 127* 125* 118*  BUN 31* 33* 37* 48*  CREATININE 3.51* 3.25* 3.69* 3.92*  CALCIUM 9.1 8.1* 8.3* 8.1*  MG  --  1.8  --   --   PHOS  --  4.4  --   --     Liver Function Tests:  Recent Labs Lab 11/04/16 1600  AST 21  ALT 12*  ALKPHOS 54  BILITOT 0.6  PROT 7.4  ALBUMIN 4.1   No results for input(s): LIPASE, AMYLASE in the last 168 hours. No results for input(s): AMMONIA in the last 168 hours. Coagulation Profile:  Recent Labs Lab 11/04/16 1945 11/07/16 0853  INR 1.19 1.13   Cardiac Enzymes: No results for input(s): CKTOTAL, CKMB, CKMBINDEX, TROPONINI in the last 168 hours. BNP (last 3 results) No results for input(s): PROBNP in the last 8760 hours.  CBG: No results for input(s): GLUCAP in the last 168 hours.  Studies: [place] Renal  Result Date: 11/07/2016 CLINICAL DATA:  Patient with hydronephrosis. Follow-up evaluation after catheter insertion. EXAM: RENAL / URINARY TRACT ULTRASOUND COMPLETE COMPARISON:  CT 11/04/2016. FINDINGS: Right Kidney: Length: 14.9 cm. Cortical thinning. Marked hydronephrosis and dilatation of the proximal right ureter. Left Kidney: Length: 16.8 cm.  Cortical thinning. Marked hydronephrosis and dilatation of the proximal left ureter. Bladder: Foley catheter in place. Urinary bladder is still mildly distended. Nonspecific calcification demonstrated within the bladder base. IMPRESSION: Persistent severe bilateral hydronephrosis and associated cortical thinning/atrophy of the kidneys bilaterally. Electronically Signed   By: Annia Beltrew  Davis M.D.   On: 11/07/2016 10:08     Scheduled Meds: . amLODipine  5 mg Oral Daily  . cefTRIAXone (ROCEPHIN)  IV  2 g Intravenous Q24H  . famotidine  20 mg Oral QHS  . ferrous sulfate  325 mg Oral TID WC  . levETIRAcetam  500 mg Intravenous Q12H  . polyethylene glycol  17 g Oral Daily   Continuous Infusions: PRN Meds: acetaminophen, HYDROcodone-acetaminophen, morphine injection, ondansetron **OR** ondansetron (ZOFRAN) IV, promethazine  Time spent: 30 minutes  Author: Lynden OxfordPranav Patel, MD [facility] Pager: [phone] 11/07/2016 4:35 PM  If 7PM-7AM, please contact night-coverage at [email or web address], password [email or web address]

## 2016-11-07 NOTE — Progress Notes (Signed)
*  PRELIMINARY RESULTS* Echocardiogram 2D Echocardiogram has been performed.  Jeremy Roach, Jeremy Roach 11/07/2016, 3:58 PM

## 2016-11-08 DIAGNOSIS — D631 Anemia in chronic kidney disease: Secondary | ICD-10-CM | POA: Diagnosis present

## 2016-11-08 DIAGNOSIS — N189 Chronic kidney disease, unspecified: Secondary | ICD-10-CM

## 2016-11-08 DIAGNOSIS — Z9889 Other specified postprocedural states: Secondary | ICD-10-CM

## 2016-11-08 DIAGNOSIS — E872 Acidosis, unspecified: Secondary | ICD-10-CM | POA: Diagnosis present

## 2016-11-08 DIAGNOSIS — N184 Chronic kidney disease, stage 4 (severe): Secondary | ICD-10-CM

## 2016-11-08 DIAGNOSIS — N1330 Unspecified hydronephrosis: Secondary | ICD-10-CM | POA: Diagnosis present

## 2016-11-08 DIAGNOSIS — N179 Acute kidney failure, unspecified: Secondary | ICD-10-CM | POA: Diagnosis present

## 2016-11-08 LAB — RENAL FUNCTION PANEL
ALBUMIN: 2.9 g/dL — AB (ref 3.5–5.0)
Anion gap: 9 (ref 5–15)
BUN: 49 mg/dL — AB (ref 6–20)
CALCIUM: 8.1 mg/dL — AB (ref 8.9–10.3)
CO2: 17 mmol/L — ABNORMAL LOW (ref 22–32)
Chloride: 117 mmol/L — ABNORMAL HIGH (ref 101–111)
Creatinine, Ser: 4.2 mg/dL — ABNORMAL HIGH (ref 0.61–1.24)
GFR calc Af Amer: 14 mL/min — ABNORMAL LOW (ref >60)
GFR calc non Af Amer: 12 mL/min — ABNORMAL LOW (ref >60)
GLUCOSE: 94 mg/dL (ref 65–99)
PHOSPHORUS: 4.8 mg/dL — AB (ref 2.5–4.6)
Potassium: 4.4 mmol/L (ref 3.5–5.1)
SODIUM: 143 mmol/L (ref 135–145)

## 2016-11-08 LAB — CBC
HCT: 24.8 % — ABNORMAL LOW (ref 39.0–52.0)
Hemoglobin: 8 g/dL — ABNORMAL LOW (ref 13.0–17.0)
MCH: 29.1 pg (ref 26.0–34.0)
MCHC: 32.3 g/dL (ref 30.0–36.0)
MCV: 90.2 fL (ref 78.0–100.0)
PLATELETS: 207 10*3/uL (ref 150–400)
RBC: 2.75 MIL/uL — ABNORMAL LOW (ref 4.22–5.81)
RDW: 14.5 % (ref 11.5–15.5)
WBC: 9.9 10*3/uL (ref 4.0–10.5)

## 2016-11-08 MED ORDER — AMLODIPINE BESYLATE 5 MG PO TABS: 5.0000 mg | ORAL_TABLET | Freq: Every day | ORAL | 0 refills | Status: AC

## 2016-11-08 MED ORDER — LEVETIRACETAM 500 MG PO TABS: 500.0000 mg | ORAL_TABLET | Freq: Two times a day (BID) | ORAL | 0 refills | Status: AC

## 2016-11-08 MED ORDER — ISOSORB DINITRATE-HYDRALAZINE 20-37.5 MG PO TABS
1.0000 | ORAL_TABLET | Freq: Three times a day (TID) | ORAL | Status: DC
  Administered 2016-11-08: 1 via ORAL
  Filled 2016-11-08 (×6): qty 1

## 2016-11-08 MED ORDER — ISOSORB DINITRATE-HYDRALAZINE 20-37.5 MG PO TABS: 1.0000 | ORAL_TABLET | Freq: Three times a day (TID) | ORAL | 0 refills | Status: AC

## 2016-11-08 NOTE — Progress Notes (Signed)
Stopped by to see pt that in progression nurse stated wanted to be released AMA. He seemed glad to see me and greeted me warmly. He was about to start therapy, so will try again another time. Nurse told me he was no longer trying to leave AMA b/c she'd told him that if he did his insurance wouldn't cover his stay so far. He'd said he guessed he was stuck here then. Chaplain available for follow-up.

## 2016-11-08 NOTE — Care Management Important Message (Signed)
Important Message  Patient Details  Name: Jeremy CanavanBobby Roach MRN: [id number] Date of Birth: 02/02/1936   Medicare Important Message Given:  Yes    Bushra Denman 11/08/2016, 11:47 AM

## 2016-11-08 NOTE — Consult Note (Signed)
[facility] Consult Note     Date: 11/08/2016                  Patient Name:  Jabron Weese  MRN: [id number]  DOB: [birth year]  Age / Sex: 80 y.o., male         PCP: No PCP Per Patient                 Service Requesting Consult: [facility]                 Reason for Consult: Presumed acute on chronic CKD            Chief Complaint: lethargy  HPI: Pt is a 40M with no significant PMH who was admitted on 11/9 with lethargy and was found to have a subdural hematoma with midline shift.  He underwent urgent decompression surgery and was transferred to the floor today.  He also had hypertensive emergency and was on a cardizem gtt.  We are now seeing him in consultation at the request of Dr. Posey Pronto for evaluation and recommendations surrounding presumed acute on chronic CKD.    Briefly, pt presented with a creatinine of 3.5.  A CT abd/ pelvis 11/9 showed bilateral hydronephrosis and hydroureter with bladder outlet obstruction and an estimated bladder volume of 4.4L.  Foley was placed.  A renal ultrasound on 11/12 showed persistent hydro bilaterally with marked renal cortical thinning and atrophy bilaterally.  Creatinine has risen to 4.2.  Urology was consulted; they recommended PCN placement in the setting of rising creatinine and failure of Foley to relieve hydronephrosis.  The patient adamantly refused any further intervention and nearly left AMA today.  UOP has been ~ 700 cc  He denies any complaints now.  Did not note any problems with urination prior to this hospitalization.   History reviewed. No pertinent past medical history.  Past Surgical History:  Procedure Laterality Date  . CRANIOTOMY Left 11/04/2016   Procedure: CRANIOTOMY HEMATOMA EVACUATION SUBDURAL;  Surgeon: Ashok Pall, MD;  Location: [place];  Service: Neurosurgery;  Laterality: Left;    History reviewed. No pertinent family history. Social History:  reports that he has never smoked. He has never used smokeless  tobacco. He reports that he does not drink alcohol or use drugs.  Allergies: No Known Allergies  Medications Prior to Admission  Medication Sig Dispense Refill  . Ascorbic Acid (VITAMIN C PO) Take 1 tablet by mouth daily.    . Multiple Vitamins-Minerals (ONE-A-DAY MENS 50+ ADVANTAGE PO) Take 1 tablet by mouth daily.    Marland Kitchen POTASSIUM PO Take 1 tablet by mouth daily.    . Pyridoxine HCl (VITAMIN B-6 PO) Take 1 tablet by mouth daily.      Results for orders placed or performed during the hospital encounter of 11/04/16 (from the past 48 hour(s))  Basic metabolic panel     Status: Abnormal   Collection Time: 11/07/16  2:30 AM  Result Value Ref Range   Sodium 140 135 - 145 mmol/L   Potassium 4.3 3.5 - 5.1 mmol/L   Chloride 115 (H) 101 - 111 mmol/L   CO2 17 (L) 22 - 32 mmol/L   Glucose, Bld 118 (H) 65 - 99 mg/dL   BUN 48 (H) 6 - 20 mg/dL   Creatinine, Ser 3.92 (H) 0.61 - 1.24 mg/dL   Calcium 8.1 (L) 8.9 - 10.3 mg/dL   GFR calc non Af Amer 13 (L) >60 mL/min   GFR calc Af Wyvonnia Lora  16 (L) >60 mL/min    Comment: (NOTE) The eGFR has been calculated using the CKD EPI equation. This calculation has not been validated in all clinical situations. eGFR's persistently <60 mL/min signify possible Chronic Kidney Disease.    Anion gap 8 5 - 15  CBC     Status: Abnormal   Collection Time: 11/07/16  2:30 AM  Result Value Ref Range   WBC 11.3 (H) 4.0 - 10.5 K/uL   RBC 2.56 (L) 4.22 - 5.81 MIL/uL   Hemoglobin 7.5 (L) 13.0 - 17.0 g/dL   HCT 22.6 (L) 39.0 - 52.0 %   MCV 88.3 78.0 - 100.0 fL   MCH 29.3 26.0 - 34.0 pg   MCHC 33.2 30.0 - 36.0 g/dL   RDW 14.6 11.5 - 15.5 %   Platelets 199 150 - 400 K/uL  Type and screen [place]     Status: None   Collection Time: 11/07/16  8:51 AM  Result Value Ref Range   ABO/RH(D) B POS    Antibody Screen NEG    Sample Expiration 11/10/2016   ABO/Rh     Status: None   Collection Time: 11/07/16  8:51 AM  Result Value Ref Range   ABO/RH(D) B POS    Reticulocytes     Status: Abnormal   Collection Time: 11/07/16  8:53 AM  Result Value Ref Range   Retic Ct Pct 0.7 0.4 - 3.1 %   RBC. 3.20 (L) 4.22 - 5.81 MIL/uL   Retic Count, Manual 22.4 19.0 - 186.0 K/uL  Lactate dehydrogenase     Status: Abnormal   Collection Time: 11/07/16  8:53 AM  Result Value Ref Range   LDH 198 (H) 98 - 192 U/L  Protime-INR     Status: None   Collection Time: 11/07/16  8:53 AM  Result Value Ref Range   Prothrombin Time 14.5 11.4 - 15.2 seconds   INR 1.13   Ferritin     Status: None   Collection Time: 11/07/16  8:53 AM  Result Value Ref Range   Ferritin 139 24 - 336 ng/mL  Iron and TIBC     Status: Abnormal   Collection Time: 11/07/16  8:53 AM  Result Value Ref Range   Iron 12 (L) 45 - 182 ug/dL   TIBC 190 (L) 250 - 450 ug/dL   Saturation Ratios 6 (L) 17.9 - 39.5 %   UIBC 178 ug/dL  Vitamin B12     Status: None   Collection Time: 11/07/16  8:53 AM  Result Value Ref Range   Vitamin B-12 852 180 - 914 pg/mL    Comment: (NOTE) This assay is not validated for testing neonatal or myeloproliferative syndrome specimens for Vitamin B12 levels.   Folate     Status: None   Collection Time: 11/07/16  8:53 AM  Result Value Ref Range   Folate 13.7 >5.9 ng/mL  Osmolality     Status: Abnormal   Collection Time: 11/07/16  8:53 AM  Result Value Ref Range   Osmolality 320 (H) 275 - 295 mOsm/kg  Urinalysis with microscopic (not at [facility])     Status: Abnormal   Collection Time: 11/07/16  9:04 AM  Result Value Ref Range   Color, Urine YELLOW YELLOW   APPearance CLOUDY (A) CLEAR   Specific Gravity, Urine 1.010 1.005 - 1.030   pH 6.5 5.0 - 8.0   Glucose, UA NEGATIVE NEGATIVE mg/dL   Hgb urine dipstick LARGE (A) NEGATIVE   Bilirubin  Urine NEGATIVE NEGATIVE   Ketones, ur NEGATIVE NEGATIVE mg/dL   Protein, ur 30 (A) NEGATIVE mg/dL   Nitrite NEGATIVE NEGATIVE   Leukocytes, UA TRACE (A) NEGATIVE   WBC, UA 0-5 0 - 5 WBC/hpf   RBC / HPF TOO NUMEROUS TO COUNT 0 -  5 RBC/hpf   Bacteria, UA NONE SEEN NONE SEEN   Squamous Epithelial / LPF 0-5 (A) NONE SEEN  Osmolality, urine     Status: None   Collection Time: 11/07/16  9:05 AM  Result Value Ref Range   Osmolality, Ur 377 300 - 900 mOsm/kg  Sodium, urine, random     Status: None   Collection Time: 11/07/16  9:06 AM  Result Value Ref Range   Sodium, Ur 99 mmol/L  Creatinine, urine, random     Status: None   Collection Time: 11/07/16  9:06 AM  Result Value Ref Range   Creatinine, Urine 65.42 mg/dL  CBC     Status: Abnormal   Collection Time: 11/07/16  1:52 PM  Result Value Ref Range   WBC 10.0 4.0 - 10.5 K/uL   RBC 2.67 (L) 4.22 - 5.81 MIL/uL   Hemoglobin 7.7 (L) 13.0 - 17.0 g/dL   HCT 23.5 (L) 39.0 - 52.0 %   MCV 88.0 78.0 - 100.0 fL   MCH 28.8 26.0 - 34.0 pg   MCHC 32.8 30.0 - 36.0 g/dL   RDW 14.5 11.5 - 15.5 %   Platelets 215 150 - 400 K/uL  Renal function panel     Status: Abnormal   Collection Time: 11/08/16  5:45 AM  Result Value Ref Range   Sodium 143 135 - 145 mmol/L   Potassium 4.4 3.5 - 5.1 mmol/L   Chloride 117 (H) 101 - 111 mmol/L   CO2 17 (L) 22 - 32 mmol/L   Glucose, Bld 94 65 - 99 mg/dL   BUN 49 (H) 6 - 20 mg/dL   Creatinine, Ser 4.20 (H) 0.61 - 1.24 mg/dL   Calcium 8.1 (L) 8.9 - 10.3 mg/dL   Phosphorus 4.8 (H) 2.5 - 4.6 mg/dL   Albumin 2.9 (L) 3.5 - 5.0 g/dL   GFR calc non Af Amer 12 (L) >60 mL/min   GFR calc Af Amer 14 (L) >60 mL/min    Comment: (NOTE) The eGFR has been calculated using the CKD EPI equation. This calculation has not been validated in all clinical situations. eGFR's persistently <60 mL/min signify possible Chronic Kidney Disease.    Anion gap 9 5 - 15  CBC     Status: Abnormal   Collection Time: 11/08/16  5:45 AM  Result Value Ref Range   WBC 9.9 4.0 - 10.5 K/uL   RBC 2.75 (L) 4.22 - 5.81 MIL/uL   Hemoglobin 8.0 (L) 13.0 - 17.0 g/dL   HCT 24.8 (L) 39.0 - 52.0 %   MCV 90.2 78.0 - 100.0 fL   MCH 29.1 26.0 - 34.0 pg   MCHC 32.3 30.0 - 36.0 g/dL    RDW 14.5 11.5 - 15.5 %   Platelets 207 150 - 400 K/uL   US Renal  Result Date: 11/07/2016 CLINICAL DATA:  Patient with hydronephrosis. Follow-up evaluation after catheter insertion. EXAM: RENAL / URINARY TRACT ULTRASOUND COMPLETE COMPARISON:  CT 11/04/2016. FINDINGS: Right Kidney: Length: 14.9 cm. Cortical thinning. Marked hydronephrosis and dilatation of the proximal right ureter. Left Kidney: Length: 16.8 cm. Cortical thinning. Marked hydronephrosis and dilatation of the proximal left ureter. Bladder: Foley catheter in place. Urinary bladder is  still mildly distended. Nonspecific calcification demonstrated within the bladder base. IMPRESSION: Persistent severe bilateral hydronephrosis and associated cortical thinning/atrophy of the kidneys bilaterally. Electronically Signed   By: Lovey Newcomer M.D.   On: 11/07/2016 10:08    ROS  All other review of systems negative   Blood pressure (!) 168/84, pulse 84, temperature 97.5 F (36.4 C), temperature source Oral, resp. rate 16, height _0  (1.778 m), weight 71.2 kg (156 lb 15.5 oz), SpO2 96 %. Physical Exam  GEN: NAD, sitting and eating dinner HEENT: EOMI, PERRL, well healing craniotomy NECK: Supple, mildly distended neck veins PULM: normal WOB CV RRR ABD nontender EXT: no LE edema GU rose colored urine in Foley.  Assessment/Plan  1.  Presumed acute on chronic kidney injury: Pt has an unclear baseline, but there is an element of chronicity given the cortical thinning on renal ultrasound.  Pt should have much more urine output than he currently does if he was truly having a post-obstructive diuresis.  I agree that he probably needs PCNs.  He was still adamant about not receiving further intervention, preferring to "talk to his kidneys" to improve their function.  He stated that he could "always come back" to the hospital to get treatment if he felt that things weren't improving.  He plans to give it 6 months.  I explained to him that generally  renal failure doesn't cause any symptoms until it is so severe that dialysis is imperative.  I also explained that severe metabolic derangements could result in serious harm including death if not detected and appropriately treated, and that this could occur within days.  I also stated that without further treatment, there was a very real possibility that pt would need dialysis.  He stated that he was "always a risk taker" and that he was "willing to take that risk".  He seems quite eccentric but appears to cognitively manipulate information appropriately.  In the setting of his subdural, neurocognitive testing and/ or a capacity eval may not be a bad idea.  2.  Anemia:  % sat 6.  Normocytic Recommend ferraheme x 2 if pt willing.  May need Aranesp after iron replete.  3.  Subdural hematoma: s/p evacuation with neurosurgery.  No new recs today.  4.  HTNsive emergency: on amlodipine and BiDil.  Currently BP goal 140-160; eventually would recommend BP < 130.  Could use BB if needed esp in setting of EF 40% and moderate LVH as long as not contraindicated in immediate postop setting.  Cannot use ACEi/ARB.     Madelon Lips, MD [facility]  pgr [phone] Cell [phone] 11/08/2016, 4:53 PM

## 2016-11-08 NOTE — Progress Notes (Signed)
Physical Therapy Treatment and Discharge Patient Details Name: Jeremy Roach MRN: [id number] DOB: 06/17/1936 Today's Date: 11/08/2016    History of Present Illness Patient is a 80 y/o male admitted 11/04/16 with progressive altered mental status following fall 2 weeks pta.  Found to have acute on chronic b/l SDH, now s/p craniotomy for evacuation of SDH.  Patient also with renal failure with b/l hydroureteronephrosis, UTI, HTN, anemia..      PT Comments    Patient did very well with balance activities. Remains confused related to events leading to his surgery ("I came home one day and 11 EMS technicians were there waiting for me. They left and another 11 showed up. They travel in teams of 11.") Poor ability to describe nature of fall several weeks ago. Currently patient is at a supervision level for mobility due to cognition. Family has confirmed to OT that they will provide 24/7 supervision. No further PT needs identified.   Follow Up Recommendations  Home health PT;Supervision/Assistance - 24 hour (OT confirmed with family need for supervision)     Equipment Recommendations  None recommended by PT    Recommendations for Other Services       Precautions / Restrictions Precautions Precautions: Fall Precaution Comments: unclear how pt fell PTA (he reports he was working on something and slipped) Restrictions Weight Bearing Restrictions: No    Mobility  Bed Mobility               General bed mobility comments: Patient in chair  Transfers Overall transfer level: Needs assistance Equipment used: None Transfers: Sit to/from Stand Sit to Stand: Supervision         General transfer comment: Supervision for safety only (h/o fall; decr cognition)  Ambulation/Gait Ambulation/Gait assistance: Min guard;Supervision Ambulation Distance (Feet): 450 Feet Assistive device: None Gait Pattern/deviations: Step-through pattern;WFL(Within Functional Limits) Gait velocity: decreased   General Gait Details: Patient with slow, steady gait with no assistive device. When asked to incr his pace, he began to jog with max cues to stop jogging and return to walking. Assist for safety only.   Stairs            Wheelchair Mobility    Modified Rankin (Stroke Patients Only)       Balance Overall balance assessment: History of Falls Sitting-balance support: No upper extremity supported Sitting balance-Leahy Scale: Good     Standing balance support: No upper extremity supported Standing balance-Leahy Scale: Normal Standing balance comment: able to ambulate without loss of balance while turning head to look at numbers next to rooms looking left, right, and turning half way around to read one that I let him go by so that he would have to turn around to see it         [place] - Eyes Opened: 60 [place] - Eyes Closed: 30 High level balance activites: Backward walking;Direction changes;Turns;Sudden stops;Head turns High Level Balance Comments: no imbalance during activities    Cognition Arousal/Alertness: Awake/alert Behavior During Therapy: Impulsive;Anxious Overall Cognitive Status: No family/caregiver present to determine baseline cognitive functioning (Oriented x3.  Difficulty with situation) Area of Impairment: Orientation;Attention;Awareness;Memory Orientation Level: Disoriented to;Situation Current Attention Level: Sustained Memory: Decreased recall of precautions;Decreased short-term memory         General Comments: unclear on how he fell 2 weeks PTA. Patient unable to recall why he is in hospital.  "They told me they were giving me something to relax before I got into the ambulance, and then I woke up and this (craniotomy  stitches) was on my head."  Repeated cueing to stay on task.     Exercises General Exercises - Lower Extremity Toe Raises: AROM;Both;10 reps;Standing (closeguarding for safety) Heel Raises: AROM;Both;10 reps;Standing    General  Comments        Pertinent Vitals/Pain Pain Assessment: No/denies pain    Home Living                      Prior Function            PT Goals (current goals can now be found in the care plan section) Acute Rehab PT Goals Patient Stated Goal: home Time For Goal Achievement: 11/13/16 Progress towards PT goals: Goals met/education completed, patient discharged from PT    Frequency    Min 3X/week      PT Plan Current plan remains appropriate    Co-evaluation             End of Session Equipment Utilized During Treatment: Gait belt Activity Tolerance: Patient tolerated treatment well Patient left: in chair;with call bell/phone within reach;with chair alarm set   Patient is being discharged from [facility] secondary to:  Goals met and no further therapy needs identified.   Please see latest Therapy Progress Note above for current level of functioning and progress toward goals.  Progress and discharge plan and discussed with patient/caregiver and they  Agree    Time: 4196-2229 PT Time Calculation (min) (ACUTE ONLY): 20 min  Charges:  $Gait Training: 8-22 mins                    G Codes:      Jeremy Roach Nov 22, 2016, 3:03 PM  Pager [phone]

## 2016-11-08 NOTE — Progress Notes (Signed)
Patient ID: Jeremy CanavanBobby Ent, male   DOB: 04/08/1936, 80 y.o.   MRN: [id number] BP (!) 165/85 (BP Location: Left Arm)   Pulse 85   Temp 98.5 F (36.9 C) (Oral)   Resp 20   Ht 5\' 10"  (1.778 m)   Wt 71.2 kg (156 lb 15.5 oz)   SpO2 100%   BMI 22.52 kg/m  Patient currently sleeping. Neurologically has done well today per nursing staff. Wound is clean, dry, no signs of infection.  Renal situation being worked out, patient has been advised to proceed with evaluation.  No new neurosurgery recommendations.

## 2016-11-08 NOTE — Progress Notes (Signed)
Pt resistant to all proposed treatments of urinary diagnoses.  Wants to be discharged today. Strongly dislikes bed alarm and not being independent in the hospital room in spite of reminders that he has had surgery and had frequent falls at home.  Accuses nurse of being self-centered when told that keeping patient from falling is priority on neurology unit.  Pt refuses any and all information in order to make an informed decision about health care plan. Lawson RadarHeather M Augie Vane

## 2016-11-08 NOTE — Progress Notes (Signed)
[facility] Progress Note  Patient: Jeremy Roach JXB:[id number]   PCP: No PCP Per Patient DOB: [birth year]   DOA: 11/04/2016   DOS: 11/08/2016   Date of Service: the patient was seen and examined on 11/08/2016  Brief hospital course: Pt. with No significant PMH; admitted on 11/04/2016, with complaint of lethargy, was found to have subdural hematoma with midline shift. He underwent urgent decompression surgery and was in the ICU and transferred to MedSurg today. Currently further plan is continue further workup for renal dysfunction, control blood pressure.  Assessment and Plan: 1. Subdural hematoma S/P urgent evacuation left hemispheric mixed density subdural hematoma. Tolerated procedure well. No focal deficit on examination. Neurosurgery recommends continue current management.  2. Hypertensive Emergency. Blood pressure significantly elevated. Unlikely the cause of patient's SDH. Discussed with neurosurgery, blood pressure control is recommended per normal medical parameters. I would Continue amlodipine and add BiDil 3 times a day. Use when necessary hydralazine. Goal systolic 140-160   3. Acute kidney injury, suspecting large component of it being chronic. Bilateral hydronephrosis. Urinary retention. S/P Foley catheter placement. Urology consult. Appreciate input Renal function progressively worsening. Likely due to poor control of the blood pressure as well as persistent obstruction. Repeat ultrasound shows persistent hydronephrosis. Ideal plan would be to place a percutaneous nephrostomy for decompression and once his renal function improves him to internalize tube with ureteral stent and then long-term stent management.  Patient's daughter was at bedside, and I spent almost an hour discussing regarding patient's current condition, diagnosis as well as prognosis with or without treatment. Patient is fixed on"alarm bells ringing, IVs hooked up, all this taking his freedom  away." He wants to go home and let his body decide. He states that his body is telling him that "because of the kidney he is suffering and if so that the kidneys go to hell". Patient understand the risk of progressive renal dysfunction and potential death should he does not undergo procedure with regards to his hydronephrosis. I offered the family, if they want me to discuss with patient's wife and I also offered getting a psych eval to see the patient is competent to make medical decision. Patient's daughter thinks that the patient is in his sound mind making a decision based on his quality of life, and it may not be a choice similar to everybody else. I explained that with worsening renal function and uncontrolled hypertension, it would not be medically safe to discharge the patient home and if he leaves the hospital, he would be leaving AGAINST MEDICAL ADVICE.  After discussing with the RN the patient is not relieving the hospital, therefore nephrology is consulted due to patient's worsening renal function.  4. Normocytic anemia. Iron level low, B-12 normal, folic acid normal, retic normal, no external bleeding. Initiate iron supplementation. H&H remains stable. Possibility of anemia of chronic kidney disease cannot be ruled out.  5. UTI.  patient started on IV ceftriaxone. Recheck culture. Complete 5 day treatment.  6. Seizure prophylaxis. Continue Keppra. No driving on discharge.  Keppra prescription as well as driving instruction and antihypertensive medication provided as physical prescription in case patient is leaving AMA.  Pain management: When necessary Tylenol, when necessary Norco Activity: Consulted physical therapy Bowel regimen: last BM prior to admission, bowel regimen initiated Diet: Cardiac diet DVT Prophylaxis: mechanical compression device.  Advance goals of care discussion: Full code  Family Communication: family was present at bedside, at the time of interview.  The pt provided permission to discuss medical  plan with the family. Opportunity was given to ask question and all questions were answered satisfactorily.   Disposition:  Discharge to home. Expected discharge date: 11/10/2016, stabilization of the renal function  Consultants: Urology, neurosurgery, CCM prior admission Procedures: S/P craniotomy  Antibiotics: Anti-infectives    Start     Dose/Rate Route Frequency Ordered Stop   11/04/16 2300  cefTRIAXone (ROCEPHIN) 2 g in dextrose 5 % 50 mL IVPB     2 g 100 mL/hr over 30 Minutes Intravenous Every 24 hours 11/04/16 2215       Subjective: Denies any acute complaint, does not feel that he needs any procedure or surgery.  Objective: Physical Exam: Vitals:   11/08/16 0500 11/08/16 0557 11/08/16 1000 11/08/16 1401  BP:  (!) 167/84 (!) 169/86 (!) 168/84  Pulse:  82 87 84  Resp:  16 16 16   Temp:  97.9 F (36.6 C) 97.8 F (36.6 C) 97.5 F (36.4 C)  TempSrc:  Oral Oral Oral  SpO2:  100% 100% 96%  Weight: 71.2 kg (156 lb 15.5 oz)     Height:        Intake/Output Summary (Last 24 hours) at 11/08/16 1517 Last data filed at 11/08/16 1330  Gross per 24 hour  Intake              780 ml  Output              850 ml  Net              -70 ml   Filed Weights   11/05/16 0218 11/06/16 0418 11/08/16 0500  Weight: 68.2 kg (150 lb 5.7 oz) 68.2 kg (150 lb 5.7 oz) 71.2 kg (156 lb 15.5 oz)    General: Alert, Awake and Oriented to Time, Place and Person. Appear in  distress, affect appropriate Eyes: PERRL, Conjunctiva normal ENT: Oral Mucosa clear moist. Neck: no JVD, no Abnormal Mass Or lumps Cardiovascular: S1 and S2 Present, no Murmur, Respiratory: Bilateral Air entry equal and Decreased, no use of accessory muscle, Clear to Auscultation, no Crackles, no wheezes Abdomen: Bowel Sound present, Soft and no tenderness Skin: no redness, no Rash, no induration Extremities: no Pedal edema, no calf tenderness Neurologic: Grossly no focal neuro  deficit. Bilaterally Equal motor strength  Data Reviewed: CBC:  Recent Labs Lab 11/04/16 1600 11/05/16 0440 11/06/16 0220 11/07/16 0230 11/07/16 1352 11/08/16 0545  WBC 7.6 10.1 12.5* 11.3* 10.0 9.9  NEUTROABS 6.3  --   --   --   --   --   HGB 9.0* 8.0* 7.8* 7.5* 7.7* 8.0*  HCT 27.6* 24.8* 24.0* 22.6* 23.5* 24.8*  MCV 89.6 89.2 88.9 88.3 88.0 90.2  PLT 276 255 221 199 215 207   Basic Metabolic Panel:  Recent Labs Lab 11/04/16 1600 11/05/16 0440 11/06/16 0220 11/07/16 0230 11/08/16 0545  NA 141 142 144 140 143  K 4.4 4.3 4.0 4.3 4.4  CL 110 115* 115* 115* 117*  CO2 20* 21* 20* 17* 17*  GLUCOSE 106* 127* 125* 118* 94  BUN 31* 33* 37* 48* 49*  CREATININE 3.51* 3.25* 3.69* 3.92* 4.20*  CALCIUM 9.1 8.1* 8.3* 8.1* 8.1*  MG  --  1.8  --   --   --   PHOS  --  4.4  --   --  4.8*    Liver Function Tests:  Recent Labs Lab 11/04/16 1600 11/08/16 0545  AST 21  --   ALT 12*  --  ALKPHOS 54  --   BILITOT 0.6  --   PROT 7.4  --   ALBUMIN 4.1 2.9*   No results for input(s): LIPASE, AMYLASE in the last 168 hours. No results for input(s): AMMONIA in the last 168 hours. Coagulation Profile:  Recent Labs Lab 11/04/16 1945 11/07/16 0853  INR 1.19 1.13   Cardiac Enzymes: No results for input(s): CKTOTAL, CKMB, CKMBINDEX, TROPONINI in the last 168 hours. BNP (last 3 results) No results for input(s): PROBNP in the last 8760 hours.  CBG: No results for input(s): GLUCAP in the last 168 hours.  Studies: No results found.   Scheduled Meds: . amLODipine  5 mg Oral Daily  . cefTRIAXone (ROCEPHIN)  IV  2 g Intravenous Q24H  . famotidine  20 mg Oral QHS  . ferrous sulfate  325 mg Oral TID WC  . isosorbide-hydrALAZINE  1 tablet Oral TID  . levETIRAcetam  500 mg Intravenous Q12H  . polyethylene glycol  17 g Oral Daily   Continuous Infusions: PRN Meds: acetaminophen, HYDROcodone-acetaminophen, morphine injection, ondansetron **OR** ondansetron (ZOFRAN) IV,  promethazine  Time spent: 30 minutes  Author: Lynden OxfordPranav Yavuz Kirby, MD [facility] Pager: [phone] 11/08/2016 3:17 PM  If 7PM-7AM, please contact night-coverage at [email or web address], password [email or web address]

## 2016-11-08 NOTE — Progress Notes (Signed)
Occupational Therapy Treatment Patient Details Name: Jeremy CanavanBobby Sequeira MRN: [id number] DOB: 08/08/1936 Today's Date: 11/08/2016    History of present illness Patient is a 80 y/o male admitted 11/04/16 with progressive altered mental status following fall 2 weeks pta.  Found to have acute on chronic b/l SDH, now s/p craniotomy for evacuation of SDH.  Patient also with renal failure with b/l hydroureteronephrosis, UTI, HTN, anemia..     OT comments  This 80 yo male admitted and underwent above presents to acute OT at a S level for all basic ADLs and he will have this at home from wife and dtr, and we are recommending HHOT to assess IADLs with his dtr verbally understanding that he needs to be S in kitchen until it is deemed safe for him to do the cooking on his own. From an acute care standpoint we will sign off.  Follow Up Recommendations  Home health OT;Supervision - Intermittent;Other (comment) (no driving and only S when in kitchen)    Equipment Recommendations  None recommended by OT       Precautions / Restrictions Precautions Precautions: Fall Restrictions Weight Bearing Restrictions: No       Mobility Bed Mobility               General bed mobility comments: Patient in chair  Transfers Overall transfer level: Needs assistance Equipment used: None Transfers: Sit to/from Stand Sit to Stand: Supervision         General transfer comment: S for safety when walking up and down hallways and getting around in his room    Balance Overall balance assessment: Needs assistance Sitting-balance support: No upper extremity supported Sitting balance-Leahy Scale: Good     Standing balance support: No upper extremity supported Standing balance-Leahy Scale: Good Standing balance comment: able to ambulate without loss of balance while turning head to look at numbers next to rooms looking left, right, and turning half way around to read one that I let him go by so that he would have  to turn around to see it                   ADL Overall ADL's : Needs assistance/impaired     Grooming: Wash/dry hands;Wash/dry face;Supervision/safety;Standing;Oral care               Lower Body Dressing: Supervision/safety;Sit to/from stand   Toilet Transfer: Supervision/safety;Ambulation Toilet Transfer Details (indicate cue type and reason): no AD, recliner>out and up/down hallway reading numbers next to rooms>back to stand at sink for grooming>sit in recliner           General ADL Comments: Called his wife and dtr and spoke the to them about him not driving until MD clears him to do so. Also spoke to his wife and dtr about making sure they supervise him when he is in the kitchen to make sure he is safe since we cannot simulate that here. I told dtr that I felt like she needed to be there with him until she felt that he was getting safely back into his routine and she felt safe to leave him and her mom.                Cognition   Behavior During Therapy: WFL for tasks assessed/performed (get defensive/milding agitated when talks about why he is here--doesn't feel he needed the craniotomy)                    General Comments:  Just can't piece together why he had the craniotomy even when told by based of info in chart--he feels/thinks he was fine at home and nothing was wrong.                 Pertinent Vitals/ Pain       Pain Assessment: No/denies pain         Frequency  Min 2X/week        Progress Toward Goals  OT Goals(current goals can now be found in the care plan section)  Progress towards OT goals: Progressing toward goals     Plan Discharge plan remains appropriate       End of Session Equipment Utilized During Treatment:  (none)   Activity Tolerance Patient tolerated treatment well   Patient Left in chair;with call bell/phone within reach;with chair alarm set   Nurse Communication Other (comment) (pt fine from a mobility  standpoint, just cognitively he cannot comprehend why he had sx)        Time: [id number] OT Time Calculation (min): 23 min  Charges: OT General Charges $OT Visit: 1 Procedure OT Treatments $Self Care/Home Management : 23-37 mins  Evette GeorgesLeonard, Senon Nixon Eva [phone] 11/08/2016, 2:08 PM

## 2016-11-08 NOTE — Progress Notes (Signed)
Patient ID: Jeremy Roach, male   DOB: 1936-09-05, 80 y.o.   MRN: [id number]  Subjective: Pt seen this morning in the company of his daughter.  He denies new complaints.  Denies flank pain.  Objective: Vital signs in last 24 hours: Temp:  [97.9 F (36.6 C)-99.8 F (37.7 C)] 97.9 F (36.6 C) (11/13 0557) Pulse Rate:  [75-86] 82 (11/13 0557) Resp:  [16-20] 16 (11/13 0557) BP: (165-177)/(77-97) 167/84 (11/13 0557) SpO2:  [100 %] 100 % (11/13 0557) Weight:  [71.2 kg (156 lb 15.5 oz)] 71.2 kg (156 lb 15.5 oz) (11/13 0500)  Intake/Output from previous day: 11/12 0701 - 11/13 0700 In: 240 [P.O.:240] Out: 850 [Urine:850] Intake/Output this shift: No intake/output data recorded.  Physical Exam:  General: Alert and oriented GU: Catheter indwelling with grossly clear urine.  Lab Results:  Recent Labs  11/07/16 0230 11/07/16 1352 11/08/16 0545  HGB 7.5* 7.7* 8.0*  HCT 22.6* 23.5* 24.8*   BMET  Recent Labs  11/07/16 0230 11/08/16 0545  NA 140 143  K 4.3 4.4  CL 115* 117*  CO2 17* 17*  GLUCOSE 118* 94  BUN 48* 49*  CREATININE 3.92* 4.20*  CALCIUM 8.1* 8.1*     Studies/Results: US Renal  Result Date: 11/07/2016 CLINICAL DATA:  Patient with hydronephrosis. Follow-up evaluation after catheter insertion. EXAM: RENAL / URINARY TRACT ULTRASOUND COMPLETE COMPARISON:  CT 11/04/2016. FINDINGS: Right Kidney: Length: 14.9 cm. Cortical thinning. Marked hydronephrosis and dilatation of the proximal right ureter. Left Kidney: Length: 16.8 cm. Cortical thinning. Marked hydronephrosis and dilatation of the proximal left ureter. Bladder: Foley catheter in place. Urinary bladder is still mildly distended. Nonspecific calcification demonstrated within the bladder base. IMPRESSION: Persistent severe bilateral hydronephrosis and associated cortical thinning/atrophy of the kidneys bilaterally. Electronically Signed   By: Annia Belt M.D.   On: 11/07/2016 10:08   Recent Results (from the past  43800 hour(s))  ECHOCARDIOGRAM COMPLETE   Collection Time: 11/07/16  3:57 PM  Result Value   Weight 2,405.66   Height 70   BP 168/77   Narrative                              Software engineer Health*                   *[facility]*                         [address]                        [place], [place] [address]                            [phone]  ------------------------------------------------------------------- Transthoracic Echocardiography  Patient:    Jeremy Roach MR #:       [id number] Study Date: 11/07/2016 Gender:     M Age:        48 Height:     177.8 cm Weight:     68.2 kg BSA:        1.83 m^2 Pt. Status: Room:       5C06C   ADMITTING    Coletta Memos [id number]  ATTENDING    Billee Cashing  SONOGRAPHER  [address]     [place], [place] M  REFERRING    Lynden Oxford  M  PERFORMING   [facility], Inpatient  cc:  ------------------------------------------------------------------- LV EF: 40%  ------------------------------------------------------------------- History:   PMH:  Subdermal hematoma. Cardiomegaly.  Risk factors: Hypertension.  ------------------------------------------------------------------- Study Conclusions  - Left ventricle: The cavity size was normal. Wall thickness was   increased in a pattern of moderate LVH. The estimated ejection   fraction was 40%. Diffuse hypokinesis. Doppler parameters are   consistent with abnormal left ventricular relaxation (grade 1   diastolic dysfunction). - Aortic valve: There was no stenosis. - Aorta: Mildly dilated aortic root. Aortic root dimension: 41 mm   (ED). - Mitral valve: There was trivial regurgitation. - Left atrium: The atrium was moderately dilated. - Right ventricle: The cavity size was normal. Systolic function   was normal. - Right atrium: The atrium was mildly dilated. - Pulmonary arteries: No complete TR doppler jet so unable to   estimate PA systolic pressure. -  Inferior vena cava: The vessel was normal in size. The   respirophasic diameter changes were in the normal range (>= 50%),   consistent with normal central venous pressure. - Pericardium, extracardiac: A trivial pericardial effusion was   identified.  Impressions:  - Normal LV size with EF 40%. 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   Normal RV size and systolic function. No significant valvular   abnormalities.  ------------------------------------------------------------------- Study data:  No prior study was available for comparison.  Study status:  Routine.  Procedure:  Transthoracic echocardiography. Image quality was adequate.          Transthoracic echocardiography.  M-mode, complete 2D, spectral Doppler, and color Doppler.  Birthdate:  Patient birthdate: [birth year].  Age:  Patient is 80 yr old.  Sex:  Gender: male.    BMI: 21.6 kg/m^2.  Blood pressure:     166/97  Patient status:  Inpatient.  Study date: Study date: 11/07/2016. Study time: 02:44 PM.  Location:  Bedside.   -------------------------------------------------------------------  ------------------------------------------------------------------- Left ventricle:  The cavity size was normal. Wall thickness was increased in a pattern of moderate LVH. The estimated ejection fraction was 40%. Diffuse hypokinesis. Doppler parameters are consistent with abnormal left ventricular relaxation (grade 1 diastolic dysfunction).  ------------------------------------------------------------------- Aortic valve:   Trileaflet.  Doppler:   There was no stenosis. There was no regurgitation.  ------------------------------------------------------------------- Aorta:  Mildly dilated aortic root.  ------------------------------------------------------------------- Mitral valve:   Normal thickness leaflets .  Doppler:   There was no evidence for stenosis.   There  was trivial regurgitation. Peak gradient (D): 4 mm Hg.  ------------------------------------------------------------------- Left atrium:  The atrium was moderately dilated.  ------------------------------------------------------------------- Right ventricle:  The cavity size was normal. Systolic function was normal.  ------------------------------------------------------------------- Pulmonic valve:    Structurally normal valve.   Cusp separation was normal.  Doppler:  Transvalvular velocity was within the normal range. There was trivial regurgitation.  ------------------------------------------------------------------- Tricuspid valve:   Doppler:  There was trivial regurgitation.  ------------------------------------------------------------------- Pulmonary artery:   No complete TR doppler jet so unable to estimate PA systolic pressure.  ------------------------------------------------------------------- Right atrium:  The atrium was mildly dilated.  ------------------------------------------------------------------- Pericardium:  A trivial pericardial effusion was identified.  ------------------------------------------------------------------- Systemic veins: Inferior vena cava: The vessel was normal in size. The respirophasic diameter changes were in the normal range (>= 50%), consistent with normal central venous pressure.  ------------------------------------------------------------------- Post procedure conclusions Ascending Aorta:  - Mildly dilated aortic root.  ------------------------------------------------------------------- Measurements   Left ventricle  Value        Reference  LV ID, ED, PLAX chordal        (L)     42.7  mm     43 - 52  LV ID, ES, PLAX chordal                31.4  mm     23 - 38  LV fx shortening, PLAX chordal (L)     26    %      >=29  LV PW thickness, ED                    14.3  mm     ---------  IVS/LV PW ratio,  ED                    1.23         <=1.3  LV e&', lateral                         9.9   cm/s   ---------  LV E/e&', lateral                       10.51        ---------  LV e&', medial                          10.7  cm/s   ---------  LV E/e&', medial                        9.72         ---------  LV e&', average                         10.3  cm/s   ---------  LV E/e&', average                       10.1         ---------    Ventricular septum                     Value        Reference  IVS thickness, ED                      17.6  mm     ---------    Aorta                                  Value        Reference  Aortic root ID, ED                     37    mm     ---------    Left atrium                            Value        Reference  LA ID, A-P, ES                         39    mm     ---------  LA ID/bsa,  A-P                         2.13  cm/m^2 <=2.2  LA volume, S                           95.3  ml     ---------  LA volume/bsa, S                       52    ml/m^2 ---------  LA volume, ES, 1-p A4C                 90    ml     ---------  LA volume/bsa, ES, 1-p A4C             49.1  ml/m^2 ---------  LA volume, ES, 1-p A2C                 101   ml     ---------  LA volume/bsa, ES, 1-p A2C             55.1  ml/m^2 ---------    Mitral valve                           Value        Reference  Mitral E-wave peak velocity            104   cm/s   ---------  Mitral A-wave peak velocity            30.1  cm/s   ---------  Mitral deceleration time       (H)     257   ms     150 - 230  Mitral peak gradient, D                4     mm Hg  ---------  Mitral E/A ratio, peak                 3.5          ---------    Systemic veins                         Value        Reference  Estimated CVP                          3     mm Hg  ---------    Right ventricle                        Value        Reference  TAPSE                                  30.9  mm     ---------  Legend: (L)  and  (H)  mark  values outside specified reference range.  ------------------------------------------------------------------- Prepared and Electronically Authenticated by  Marca Anconaalton McLean, M.D. 2017-11-12T16:30:58   *Note: Due to a large number of results and/or encounters for the requested time period, some results have not been displayed. A complete set of results can be found in Results Review.    Assessment/Plan: 1) Urinary  retention/bilateral hydronephrosis/AKI vs CKD:  His renal function has not improved after 72 hrs of urethral catheter drainage.  Repeat ultrasound yesterday demonstrated bilateral severe hydronephrosis which is likely chronic and non-obstructive although upper urinary tract obstruction cannot absolutely be ruled out.  The recommended approach in this setting would be to proceed with bilateral nephrostomy tube placement to optimize drainage of the urinary tract to assess for any component of reversible obstructive uropathy with concomitant nephrology input for management of other causes and for long term management of what likely is chronic kidney disease at this point even if obstruction was the main initial cause.  I outlined the that the next steps might then involve internalization of ureteral stents followed by outpatient evaluation to assess his baseline renal function and for functional evaluation of his lower urinary tract with urodynamics.  We then discuss possibilities for long term management that might include long term ureteral stent management, long term bladder catheter management, and/or surgical intervention for bladder outlet obstruction.   I discussed the above with both the patient and daughter in detail this morning and spent about 25 minutes in consultation.  The patient still appears to adamantly wish to avoid the above process understanding the consequence of not doing anything may lead to progressive renal failure and death.  His daughter appears to want him to proceed  with intervention.  I answered numerous questions for them this morning and asked them to discuss these options further together this morning.  Ultimately, I would NOT recommend nephrostomy tube placement UNLESS patient is committed to long term management and the subsequent steps as outlined above because nephrostomy tube placement will risk bacterial colonization of the urine which could lead to serious infection if he ultimately decides not to take the subsequent steps to resolve/manage his urologic situation.  Therefore, I would not proceed with further intervention unless he changes his mind and decides to proceed.   LOS: 4 days   Syd Newsome,LES 11/08/2016, 8:02 AM

## 2016-11-09 LAB — RENAL FUNCTION PANEL
ALBUMIN: 2.7 g/dL — AB (ref 3.5–5.0)
ANION GAP: 7 (ref 5–15)
BUN: 51 mg/dL — ABNORMAL HIGH (ref 6–20)
CO2: 17 mmol/L — ABNORMAL LOW (ref 22–32)
Calcium: 7.5 mg/dL — ABNORMAL LOW (ref 8.9–10.3)
Chloride: 117 mmol/L — ABNORMAL HIGH (ref 101–111)
Creatinine, Ser: 4.44 mg/dL — ABNORMAL HIGH (ref 0.61–1.24)
GFR calc Af Amer: 13 mL/min — ABNORMAL LOW (ref >60)
GFR calc non Af Amer: 12 mL/min — ABNORMAL LOW (ref >60)
GLUCOSE: 109 mg/dL — AB (ref 65–99)
PHOSPHORUS: 4.6 mg/dL (ref 2.5–4.6)
POTASSIUM: 4.5 mmol/L (ref 3.5–5.1)
Sodium: 141 mmol/L (ref 135–145)

## 2016-11-09 LAB — CBC
HEMATOCRIT: 19.6 % — AB (ref 39.0–52.0)
HEMOGLOBIN: 6.6 g/dL — AB (ref 13.0–17.0)
MCH: 29.3 pg (ref 26.0–34.0)
MCHC: 33.2 g/dL (ref 30.0–36.0)
MCV: 88.3 fL (ref 78.0–100.0)
Platelets: 192 10*3/uL (ref 150–400)
RBC: 2.22 MIL/uL — AB (ref 4.22–5.81)
RDW: 14.2 % (ref 11.5–15.5)
WBC: 7.2 10*3/uL (ref 4.0–10.5)

## 2016-11-09 LAB — PREPARE RBC (CROSSMATCH)

## 2016-11-09 MED ORDER — SODIUM CHLORIDE 0.9 % IV SOLN: Freq: Once | INTRAVENOUS | Status: DC

## 2016-11-09 MED ORDER — TAMSULOSIN HCL 0.4 MG PO CAPS
0.4000 mg | ORAL_CAPSULE | Freq: Every day | ORAL | Status: DC
  Administered 2016-11-09: 0.4 mg via ORAL
  Filled 2016-11-09: qty 1

## 2016-11-09 MED ORDER — POLYETHYLENE GLYCOL 3350 17 G PO PACK: 17.0000 g | PACK | Freq: Every day | ORAL | 0 refills | Status: AC

## 2016-11-09 MED ORDER — FERROUS SULFATE 325 (65 FE) MG PO TABS: 325.0000 mg | ORAL_TABLET | Freq: Three times a day (TID) | ORAL | 0 refills | Status: AC

## 2016-11-09 MED ORDER — TAMSULOSIN HCL 0.4 MG PO CAPS: 0.4000 mg | ORAL_CAPSULE | Freq: Every day | ORAL | 0 refills | Status: AC

## 2016-11-09 NOTE — Progress Notes (Signed)
[facility] PROGRESS NOTE  Patient: Jeremy CanavanBobby Laduke WUJ:[id number]:[id number]   PCP: No PCP Per Patient DOB: 10/24/1936   DOA: 11/04/2016   DOS: 11/09/2016    Assessment and plan: Patient's hemoglobin dropped to 6.6 and renal function worsened further. Patient refuses blood transfusion or IV iron infusion. Patient continues to express desire to avoid any treatment and intervention and wants to go home. Discussed with patient's daughter as well as wife on the phone. I offered psychiatric evaluation for capacity evaluation. Family stayed patient is in his sound mind making the decision the way he has made throughout his life and feels that he does not need psych evaluation. I offered that if the patient wants to go home and does not want any further treatment patient be ideally discharge on home with hospice. Family agrees with that idea. We'll consult case management to arrange home with hospice and discharged the patient likely today. Patient would not want Foley catheter on discharge.  Author: Lynden OxfordPranav Fin Hupp, MD [facility] Pager: [phone] 11/09/2016 9:25 AM   If 7PM-7AM, please contact night-coverage at [email or web address], password [email or web address]

## 2016-11-09 NOTE — Discharge Instructions (Signed)
Hospice °Hospice is a service that is designed to provide people who are terminally ill and their families with medical, spiritual, and psychological support. Its aim is to improve your quality of life by keeping you as alert and comfortable as possible. Hospice is performed by a team of health care professionals and volunteers who: °· Help keep you comfortable. Hospice can be provided in your home or in a homelike setting. The hospice staff works with your family and friends to help meet your needs. You will enjoy the support of loved ones by receiving much of your basic care from family and friends. °· Provide pain relief and manage your symptoms. The staff supply all necessary medicines and equipment. °· Provide companionship when you are alone. °· Allow you and your family to rest. They may do light housekeeping, prepare meals, and run errands. °· Provide counseling. They will make sure your emotional, spiritual, and social needs and those of your family are being met. °· Provide spiritual care. Spiritual care is individualized to meet your needs and your family's needs. It may involve helping you look at what death means to you, say goodbye, or perform a specific religious ceremony or ritual. °Hospice teams often include: °· A nurse. °· A doctor. °· Social workers. °· Religious leaders (such as a chaplain). °· Trained volunteers. °WHEN SHOULD HOSPICE CARE BEGIN? °Most people who use hospice are believed to have fewer than 6 months to live. Your family and health care providers can help you decide when hospice services should begin. If your condition improves, you may discontinue the program. °WHAT SHOULD I CONSIDER BEFORE SELECTING A PROGRAM? °Most hospice programs are run by nonprofit, independent organizations. Some are affiliated with hospitals, nursing homes, or home health care agencies. Hospice programs can take place in the home or at a hospice center, hospital, or skilled nursing facility. When choosing  a hospice program, ask the following questions: °· What services are available to me? °· What services are offered to my loved ones? °· How involved are my loved ones? °· How involved is my health care provider? °· Who makes up the hospice care team? How are they trained or screened? °· How will my pain and symptoms be managed? °· If my circumstances change, can the services be provided in a different setting, such as my home or in the hospital? °· Is the program reviewed and licensed by the state or certified in some other way? °WHERE CAN I LEARN MORE ABOUT HOSPICE? °You can learn about existing hospice programs in your area from your health care providers. You can also read more about hospice online. The websites of the following organizations contain helpful information: °· The National Hospice and Palliative Care Organization (NHPCO). °· The Hospice Association of America (HAA). °· The Hospice Education Institute. °· The American Cancer Society (ACS). °· Hospice Net. °This information is not intended to replace advice given to you by your health care provider. Make sure you discuss any questions you have with your health care provider. °Document Released: 03/31/2004 Document Revised: 12/18/2013 Document Reviewed: 10/23/2013 °Elsevier Interactive Patient Education © 2017 Elsevier Inc. ° °

## 2016-11-09 NOTE — Progress Notes (Addendum)
Patient refusing to sign consent for blood transfusion. Patient informed risks of not treating Hgb level of 6.6. Patient states "he is being held hostage and does not need blood, they need to quit taking it". MD to be paged.

## 2016-11-09 NOTE — Progress Notes (Signed)
Dr. Clearence PedSchorr paged to notify patient refusing to sign blood consent.

## 2016-11-09 NOTE — Progress Notes (Signed)
Dr. Clearence PedSchorr called to notify she will allow rounding MD to address patient refusing blood transfusion in the AM . MD notified patient not currently symptomatic, but just angry about all proposed care management.

## 2016-11-09 NOTE — Progress Notes (Signed)
[facility] Progress Note    Assessment/ Plan:   1.  Presumed acute on chronic kidney injury: Pt has an unclear baseline, but there is an element of chronicity given the cortical thinning on renal ultrasound.  Creatinine not improving, and likely needs PCNs as recommended by urology; however, he has refused all intervention (see original consult note).  He is at his baseline mental status and is making decisions as he has throughout his life and it has been determined that no capacity eval needed.  Hospice has been consulted and pt will likely go home without Foley catheter.  I agree that this is a reasonable approach and his family is in agreement as well.  He understands that progressive renal failure will more than likely be life-limiting (as will progressive anemia).  2.  Anemia:  % sat 6.  normocytic.  Hgb 8-->6.6.  Refused IV iron and blood transfusion.  Refusing PO iron.    3.  Subdural hematoma: s/p evacuation with neurosurgery.    4.  HTNsive emergency: on amlodipine and BiDil.  Currently BP goal 140-160; eventually would recommend BP < 130.  Could use BB if needed esp in setting of EF 40% and moderate LVH as long as not contraindicated in immediate postop setting.  Cannot use ACEi/ARB.     I will sign off at this time.  I am available for any questions.    Subjective:     Hgb drop to 6.6 today.  Pt declined IV iron or transfusion earlier today.  Still declining these interventions.  Also declining PO iron, preferring to focus "old school home remedies".     Objective:   BP (!) 161/83 (BP Location: Left Arm)   Pulse 81   Temp 98.6 F (37 C) (Oral)   Resp 20   Ht 5\' 10"  (1.778 m)   Wt 71.2 kg (156 lb 15.5 oz)   SpO2 100%   BMI 22.52 kg/m   Intake/Output Summary (Last 24 hours) at 11/09/16 1200 Last data filed at 11/09/16 0748  Gross per 24 hour  Intake              935 ml  Output             1050 ml  Net             -115 ml   Weight change:    Physical Exam: GEN: NAD, sitting and eating lunch HEENT: EOMI, PERRL, well healing craniotomy with staples NECK: Supple, mildly distended neck veins PULM: normal WOB CV RRR ABD nontender EXT: LE edema, L > R GU rose colored urine in Foley.  Imaging: No results found.  Labs: BMET  Recent Labs Lab 11/04/16 1600 11/05/16 0440 11/06/16 0220 11/07/16 0230 11/08/16 0545 11/09/16 0251  NA 141 142 144 140 143 141  K 4.4 4.3 4.0 4.3 4.4 4.5  CL 110 115* 115* 115* 117* 117*  CO2 20* 21* 20* 17* 17* 17*  GLUCOSE 106* 127* 125* 118* 94 109*  BUN 31* 33* 37* 48* 49* 51*  CREATININE 3.51* 3.25* 3.69* 3.92* 4.20* 4.44*  CALCIUM 9.1 8.1* 8.3* 8.1* 8.1* 7.5*  PHOS  --  4.4  --   --  4.8* 4.6   CBC  Recent Labs Lab 11/04/16 1600  11/07/16 0230 11/07/16 1352 11/08/16 0545 11/09/16 0251  WBC 7.6  < > 11.3* 10.0 9.9 7.2  NEUTROABS 6.3  --   --   --   --   --  HGB 9.0*  < > 7.5* 7.7* 8.0* 6.6*  HCT 27.6*  < > 22.6* 23.5* 24.8* 19.6*  MCV 89.6  < > 88.3 88.0 90.2 88.3  PLT 276  < > 199 215 207 192  < > = values in this interval not displayed.  Medications:    . sodium chloride   Intravenous Once  . sodium chloride   Intravenous Once  . amLODipine  5 mg Oral Daily  . cefTRIAXone (ROCEPHIN)  IV  2 g Intravenous Q24H  . famotidine  20 mg Oral QHS  . ferrous sulfate  325 mg Oral TID WC  . isosorbide-hydrALAZINE  1 tablet Oral TID  . levETIRAcetam  500 mg Intravenous Q12H  . polyethylene glycol  17 g Oral Daily      Bufford ButtnerElizabeth Laney Bagshaw, MD [facility] Cell [phone] pgr [phone] 11/09/2016, 12:00 PM

## 2016-11-09 NOTE — Care Management Note (Signed)
Case Management Note  Patient Details  Name: Jeremy Roach MRN: [id number] Date of Birth: 1936/01/24  Subjective/Objective:                    Action/Plan: Plan is for patient to discharge home with hospice care. CM met with the patients daughter and provided her a list of hospice agencies in [place]. She chose [facility]. Abigail Butts with [facility] notified and up to the unit to see the daughter. Daughter requesting that [facility] not see the patient until a home visit can be arranged tomorrow. Abigail Butts understanding and going to send patients information to [facility] MD to make sure patient is appropriate for home hospice. Pt does not have any current DME needs. Pt does not have PCP. Daughter is going to ask Dr Marlou Sa to act as PCP for the patient. Dr Posey Pronto updated.   Expected Discharge Date:                  Expected Discharge Plan:  Home w Hospice Care  In-House Referral:     Discharge planning Services  CM Consult  Post Acute Care Choice:  Hospice Choice offered to:  Adult Children  DME Arranged:    DME Agency:     HH Arranged:    HH Agency:  [facility]  Status of Service:  Completed, signed off  If discussed at [place] of Stay Meetings, dates discussed:    Additional Comments:  Pollie Friar, RN 11/09/2016, 1:27 PM

## 2016-11-09 NOTE — Discharge Summary (Signed)
[facility] Discharge Summary   Patient: Jeremy Roach YQM:[id number]   PCP: No PCP Per Patient DOB: 1936/10/05   Date of admission: 11/04/2016   Date of discharge:  11/09/2016    Discharge Diagnoses:  Principal Problem:   Subdural hematoma (HCC) Active Problems:   Hypertensive emergency   Acute renal failure superimposed on stage 4 chronic kidney disease (HCC)   Bilateral hydronephrosis   Metabolic acidosis   Anemia in chronic kidney disease   S/P craniotomy   Admitted From: home Disposition:  Home with hospice  Recommendations for Outpatient Follow-up:  1. Please establish with hospice   Follow-up Information    CABBELL,KYLE L, MD. Schedule an appointment as soon as possible for a visit in 1 week(s).   Specialty:  Neurosurgery Why:  staple removal.  Contact information: [address] [place] [address] [phone]          Diet recommendation: cardaic diet  Activity: The patient is advised to gradually reintroduce usual activities.  Discharge Condition: good  Code Status: full code  History of present illness: As per the H and P dictated on admission, "80 year old male with no significant past medical history, albeit, he has not seen a doctor for several years. His wife is a Engineer, civil (consulting) and regularly checks his BP and CBG which are usually fine. He fell at home about 2 weeks PTA and hit his head. He did tell his daughter about this, but not his wife. 11/7 he noticed some fatigue, was otherwise OK. The progressively worsened until 11/9 when he was lethargic. He has also had urinary frequency, but no dysuria, weak stream, or any other complaint. Wife called EMS. 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 Neurosurgery planning for OR. Also noted to have abdominal mass on exam. Sent for CT abdomen/pelvis which demonstrated bilateral hydroureteronephrosis and  bladder distention. Bladder calcification noted possibly at the origin of the urethra. PCCM asked to admit.  Wife notes unintentional weight loss of 30 pounds over the last year."  Hospital Course:   Summary of his active problems in the hospital is as following. 1. Subdural hematoma S/P urgent evacuation left hemispheric mixed density subdural hematoma. Tolerated procedure well. No focal deficit on examination. Neurosurgery recommends continue current management.  2. Hypertensive Emergency. Blood pressure significantly elevated. Unlikely the cause of patient's SDH. Discussed with neurosurgery, blood pressure control is recommended per normal medical parameters. I would Continue amlodipine and add BiDil 3 times a day. Use when necessary hydralazine. Goal systolic 140-160   3. Acute kidney injury, suspecting large component of it being chronic. Bilateral hydronephrosis. Urinary retention. S/P Foley catheter placement. Urology consult. Appreciate input Renal function progressively worsening. Likely due to poor control of the blood pressure as well as persistent obstruction. Repeat ultrasound shows persistent hydronephrosis. Ideal plan would be to place a percutaneous nephrostomy for decompression and once his renal function improves him to internalize tube with ureteral stent and then long-term stent management.  Patient's daughter was at bedside, and I spent almost an hour discussing regarding patient's current condition, diagnosis as well as prognosis with or without treatment. Patient is fixed on"alarm bells ringing, IVs hooked up, all this taking his freedom away." He wants to go home and let his body decide. He states that his body is telling him that "because of the kidney he is suffering and if so that the kidneys  go to hell". Patient understand the risk of progressive renal dysfunction and potential death should he does not undergo procedure with regards to his  hydronephrosis. I offered the family, if they want me to discuss with patient's wife and I also offered getting a psych eval to see the patient is competent to make medical decision. Patient's daughter thinks that the patient is in his sound mind making a decision based on his quality of life, and it may not be a choice similar to everybody else. I explained that with worsening renal function and uncontrolled hypertension, it would not be medically safe to discharge the patient home and if he leaves the hospital, he would be leaving AGAINST MEDICAL ADVICE.  After discussing with the RN the patient is not relieving the hospital, therefore nephrology is consulted due to patient's worsening renal function.  4. Normocytic anemia. Iron level low, B-12 normal, folic acid normal, retic normal, no external bleeding. Initiate iron supplementation. H&H remains stable. Possibility of anemia of chronic kidney disease cannot be ruled out.  5. UTI.  patient started on IV ceftriaxone. Recheck culture. Complete 5 day treatment.  6. Seizure prophylaxis. Continue Keppra. No driving on discharge.  7. Goals of care discussion Patient's hemoglobin dropped to 6.6 and renal function worsened further. Patient refuses blood transfusion or IV iron infusion. Patient continues to express desire to avoid any treatment and intervention and wants to go home. Discussed with patient's daughter as well as wife on the phone. I offered psychiatric evaluation for capacity evaluation. Family stayed patient is in his sound mind making the decision the way he has made throughout his life and feels that he does not need psych evaluation. I offered that if the patient wants to go home and does not want any further treatment patient be ideally discharge on home with hospice. Family agrees with that idea. Patient would not want Foley catheter on discharge.  All other chronic medical condition were stable during the  hospitalization.  Patient was seen by physical therapy, who recommended home health. Pt chose against any intervention and procedure. After discussion with family they chose to go home on hospice, which was arranged by Child psychotherapist and case Production designer, theatre/television/film. On the day of the discharge the patient's vitals were stable, and no other acute medical condition were reported by patient. the patient was felt safe to be discharge at home with hospice.  Procedures and Results: left fronto temporal parietal CRANIOTOMY HEMATOMA EVACUATION SUBDURAL   Consultations:  Neurosurgery.  DISCHARGE MEDICATION: Current Discharge Medication List    START taking these medications   Details  amLODipine (NORVASC) 5 MG tablet Take 1 tablet (5 mg total) by mouth daily. Qty: 30 tablet, Refills: 0    ferrous sulfate 325 (65 FE) MG tablet Take 1 tablet (325 mg total) by mouth 3 (three) times daily with meals. Qty: 180 tablet, Refills: 0    isosorbide-hydrALAZINE (BIDIL) 20-37.5 MG tablet Take 1 tablet by mouth 3 (three) times daily. Qty: 90 tablet, Refills: 0    levETIRAcetam (KEPPRA) 500 MG tablet Take 1 tablet (500 mg total) by mouth 2 (two) times daily. Qty: 60 tablet, Refills: 0    polyethylene glycol (MIRALAX / GLYCOLAX) packet Take 17 g by mouth daily. Qty: 14 each, Refills: 0    tamsulosin (FLOMAX) 0.4 MG CAPS capsule Take 1 capsule (0.4 mg total) by mouth daily. Qty: 30 capsule, Refills: 0      CONTINUE these medications which have NOT CHANGED   Details  Ascorbic Acid (VITAMIN C PO) Take 1 tablet by mouth daily.    Multiple Vitamins-Minerals (ONE-A-DAY MENS 50+ ADVANTAGE PO) Take 1 tablet by mouth daily.    Pyridoxine HCl (VITAMIN B-6 PO) Take 1 tablet by mouth daily.      STOP taking these medications     POTASSIUM PO        No Known Allergies Discharge Instructions    Diet - low sodium heart healthy    Complete by:  As directed    Discharge instructions    Complete by:  As directed    It  is important that you read following instructions as well as go over your medication list with RN to help you understand your care after this hospitalization.  Discharge Instructions: Please follow-up with PCP in one week  Please request your primary care physician to go over all Hospital Tests and Procedure/Radiological results at the follow up,  Please get all Hospital records sent to your PCP by signing hospital release before you go home.   Do not drive, operating heavy machinery, perform activities at heights, swimming or participation in water activities or provide baby sitting services; until you have been seen by Primary Care Physician or a Neurologist and advised to do so again. Do not take more than prescribed Pain, Sleep and Anxiety Medications. You were cared for by a hospitalist during your hospital stay. If you have any questions about your discharge medications or the care you received while you were in the hospital after you are discharged, you can call the unit and ask to speak with the hospitalist on call if the hospitalist that took care of you is not available.  Once you are discharged, your primary care physician will handle any further medical issues. Please note that NO REFILLS for any discharge medications will be authorized once you are discharged, as it is imperative that you return to your primary care physician (or establish a relationship with a primary care physician if you do not have one) for your aftercare needs so that they can reassess your need for medications and monitor your lab values. You Must read complete instructions/literature along with all the possible adverse reactions/side effects for all the Medicines you take and that have been prescribed to you. Take any new Medicines after you have completely understood and accept all the possible adverse reactions/side effects. Wear Seat belts while driving. If you have smoked or chewed Tobacco in the last 2 yrs  please stop smoking and/or stop any Recreational drug use.   Driving Restrictions    Complete by:  As directed    Do not drive.   Increase activity slowly    Complete by:  As directed      Discharge Exam: Filed Weights   11/05/16 0218 11/06/16 0418 11/08/16 0500  Weight: 68.2 kg (150 lb 5.7 oz) 68.2 kg (150 lb 5.7 oz) 71.2 kg (156 lb 15.5 oz)   Vitals:   11/09/16 0620 11/09/16 1007  BP: (!) 165/77 (!) 161/83  Pulse: 79 81  Resp:  20  Temp:  98.6 F (37 C)   General: Appear in no distress, no Rash; Oral Mucosa moist. Cardiovascular: S1 and S2 Present, no Murmur, no JVD Respiratory: Bilateral Air entry present and Clear to Auscultation, no Crackles, no wheezes Abdomen: Bowel Sound present, Soft and no tenderness Extremities: no Pedal edema, no calf tenderness Neurology: Grossly no focal neuro deficit.  The results of significant diagnostics from this hospitalization (including imaging, microbiology,  ancillary and laboratory) are listed below for reference.    Significant Diagnostic Studies: Ct Abdomen Pelvis Wo Contrast  Result Date: 11/04/2016 CLINICAL DATA:  Pain after fall. Abdominal mass. Altered mental status. EXAM: CT ABDOMEN AND PELVIS WITHOUT CONTRAST TECHNIQUE: Multidetector CT imaging of the abdomen and pelvis was performed following the standard protocol without IV contrast. COMPARISON:  None. FINDINGS: Lower chest: The heart is enlarged. Hyperdense appearance of the cardiac walls and septum may be secondary to anemia. Faint calcifications noted in the left ventricle, possibly related to the mitral valve. No pericardial effusion. There is streaky atelectasis and/or scarring at each lung base left greater than right. No pneumonic consolidations. Tiny nodular density in the right lower lobe laterally consistent with a branch point for pulmonary vessel. Hepatobiliary: Nonspecific hypodensities in the left hepatic lobe the largest is 15 mm seen on series 201, image 13 with  Hounsfield unit of 14. Findings are statistically consistent with cysts and/or hemangiomata. The lack of IV contrast limits further assessment. No biliary dilatation is noted. Gallbladder appears physiologically distended without calculus. Pancreas: The pancreas is unremarkable for this unenhanced study. No ductal dilatation is apparent. Spleen: There is no splenomegaly. Adrenals/Urinary Tract: Marked bilateral hydroureteronephrosis with marked distention of the bladder to 22.3 cm craniocaudad by 13 cm AP by 15.2 cm transverse. There is a calcification along the floor of the bladder measuring 12 by 8 by 5 mm possibly at the origin of the prostatic urethra. This may be causing obstruction. Foley catheter decompression is recommended. Neither adrenal gland is well visualized due to lack of oral and IV contrast. Stomach/Bowel: No bowel obstruction or definite inflammation. Stomach is not distended. Moderate amount of stool in the right colon and rectum. Vascular/Lymphatic: Aortoiliac atherosclerosis without aneurysm. No lymphadenopathy. Numerous pelvic phleboliths are seen bilaterally in the lower pelvis. Reproductive: Enlarged prostate with peripheral and central zone calcifications measuring up to 7 cm. Other: No bowel herniation. Trace fluid along the paracolic gutters. Musculoskeletal: No acute osseous abnormality. There is degenerative disc disease at L3-4, L4-5 and L5-S1 with associated mild-to-moderate neural foraminal encroachment at L4-5. IMPRESSION: Marked bilateral hydroureteronephrosis and bladder distention. A 12 x 8 x 5 mm bladder calcification is seen possibly at the origin of the prostatic urethra which may explain the marked bladder (volume of 4.4 liters) O and renal collecting system obstruction described. Foley catheter to drainage is recommended. Electronically Signed   By: Tollie Ethavid  Kwon M.D.   On: 11/04/2016 18:36   Dg Chest 2 View  Result Date: 11/04/2016 CLINICAL DATA:  Altered mental status  EXAM: CHEST  2 VIEW COMPARISON:  None. FINDINGS: There is no focal parenchymal opacity. There is no pleural effusion or pneumothorax. There is stable cardiomegaly. The osseous structures are unremarkable. IMPRESSION: No active cardiopulmonary disease. Electronically Signed   By: Elige KoHetal  Herby Amick   On: 11/04/2016 15:19   Ct Head Wo Contrast  Result Date: 11/04/2016 CLINICAL DATA:  Altered mental status with history of fall EXAM: CT HEAD WITHOUT CONTRAST TECHNIQUE: Contiguous axial images were obtained from the base of the skull through the vertex without intravenous contrast. COMPARISON:  None. 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 This is larger on the left side, and measures at least 2.5 cm in thickness. There is approximately 12 mm of midline shift to the right. Right-sided subdural hematoma measures 1.2 cm in maximum thickness along the right parietal convexity on coronal views. There is inter hemispheric extension of subdural blood anteriorly and posteriorly, right  inter hemispheric subdural hematoma measures 8 mm in thickness. There is additional subdural blood along the right tentorium. There is compression and mass effect upon the lateral ventricles with asymmetric dilatation of the right atrium. Fourth ventricle is small but patent. There is mild effacement of the pre pontine and suprasellar cisterns. Diffuse bilateral brain swelling. There is no transtentorial herniation. Sagittal views demonstrate a possible 4 mm hypodense nodule within the posterior aspect of the pituitary gland. There is no focal mass. Vascular: No hyperdense vessels.  Carotid artery calcifications. Skull: No obvious fracture.  The mastoid air cells are clear. Sinuses/Orbits: Minimal mucosal thickening in the ethmoid sinuses. The globes appear intact. Other: None IMPRESSION: Large bilateral left greater than right acute on chronic subdural hematoma as with approximately 12 mm of midline  shift to the right. There is inter hemispheric subdural hematoma as well as right tentorial subdural. There is significant mass effect on the lateral ventricles. Fourth ventricle is patent. There is mild effacement of the basilar cisterns with diffuse brain swelling present. Possible 4 mm hypodense nodule in the posterior aspect of the pituitary gland. Eventual MRI evaluation may be considered Critical Value/emergent results were called by telephone at the time of interpretation on 11/04/2016 at 6:37 pm to Dr. Erma Heritage , who verbally acknowledged these results. Electronically Signed   By: Jasmine Pang M.D.   On: 11/04/2016 18:37   US Renal  Result Date: 11/07/2016 CLINICAL DATA:  Patient with hydronephrosis. Follow-up evaluation after catheter insertion. EXAM: RENAL / URINARY TRACT ULTRASOUND COMPLETE COMPARISON:  CT 11/04/2016. FINDINGS: Right Kidney: Length: 14.9 cm. Cortical thinning. Marked hydronephrosis and dilatation of the proximal right ureter. Left Kidney: Length: 16.8 cm. Cortical thinning. Marked hydronephrosis and dilatation of the proximal left ureter. Bladder: Foley catheter in place. Urinary bladder is still mildly distended. Nonspecific calcification demonstrated within the bladder base. IMPRESSION: Persistent severe bilateral hydronephrosis and associated cortical thinning/atrophy of the kidneys bilaterally. Electronically Signed   By: Annia Belt M.D.   On: 11/07/2016 10:08   Dg Chest Port 1 View  Result Date: 11/05/2016 CLINICAL DATA:  Postop and craniotomy with subdural hematoma evacuation. EXAM: PORTABLE CHEST 1 VIEW COMPARISON:  None. FINDINGS: Cardiac enlargement without vascular congestion. No focal lung consolidation or edema. No blunting of costophrenic angles. No pneumothorax. Mediastinal contours appear intact. Degenerative changes in the spine and shoulders. IMPRESSION: Cardiac enlargement.  No evidence of active pulmonary disease. Electronically Signed   By: Burman Nieves  M.D.   On: 11/05/2016 02:11   Microbiology: Recent Results (from the past 240 hour(s))  MRSA PCR Screening     Status: None   Collection Time: 11/05/16  2:18 AM  Result Value Ref Range Status   MRSA by PCR NEGATIVE NEGATIVE Final    Comment:        The GeneXpert MRSA Assay (FDA approved for NASAL specimens only), is one component of a comprehensive MRSA colonization surveillance program. It is not intended to diagnose MRSA infection nor to guide or monitor treatment for MRSA infections.    Labs: CBC:  Recent Labs Lab 11/04/16 1600  11/06/16 0220 11/07/16 0230 11/07/16 1352 11/08/16 0545 11/09/16 0251  WBC 7.6  < > 12.5* 11.3* 10.0 9.9 7.2  NEUTROABS 6.3  --   --   --   --   --   --   HGB 9.0*  < > 7.8* 7.5* 7.7* 8.0* 6.6*  HCT 27.6*  < > 24.0* 22.6* 23.5* 24.8* 19.6*  MCV 89.6  < >  88.9 88.3 88.0 90.2 88.3  PLT 276  < > 221 199 215 207 192  < > = values in this interval not displayed. Basic Metabolic Panel:  Recent Labs Lab 11/05/16 0440 11/06/16 0220 11/07/16 0230 11/08/16 0545 11/09/16 0251  NA 142 144 140 143 141  K 4.3 4.0 4.3 4.4 4.5  CL 115* 115* 115* 117* 117*  CO2 21* 20* 17* 17* 17*  GLUCOSE 127* 125* 118* 94 109*  BUN 33* 37* 48* 49* 51*  CREATININE 3.25* 3.69* 3.92* 4.20* 4.44*  CALCIUM 8.1* 8.3* 8.1* 8.1* 7.5*  MG 1.8  --   --   --   --   PHOS 4.4  --   --  4.8* 4.6   Liver Function Tests:  Recent Labs Lab 11/04/16 1600 11/08/16 0545 11/09/16 0251  AST 21  --   --   ALT 12*  --   --   ALKPHOS 54  --   --   BILITOT 0.6  --   --   PROT 7.4  --   --   ALBUMIN 4.1 2.9* 2.7*   Time spent: 30 minutes  Signed:  Renell Allum  [facility]  11/09/2016  , 2:26 PM

## 2016-11-09 NOTE — Progress Notes (Signed)
Patient was discharged home with daughter. Discharge instructions were reviewed with the daughter, and patient. Patient and daughter verbalized understanding.  Lawernce IonYari Daija Routson, RN  11/09/2016 4:18 PM

## 2016-11-09 NOTE — Progress Notes (Signed)
[id number] Liaison RN note  Notified by Harvin HazelKelli, [facility] Hospital[facility]RN of family request [facility] after discharge. Chart and patient information currently under review to confirm hospice eligibility.  Spoke with daughter, Jeremy Roach, in family waiting room.  Also spoke with patient's Jeremy Roach on the telephone.  Initiated education related to hospice philosophy, services and team approach to care.  Family verbalized understanding of the information provided.  Per discussion, plan is to discharge home by personal vehicle with daughter today.  Patient will need prescriptions for any medications he does not currently have at home.  DME needs discussed.  None requested by daughter, Jeremy Roach.  [facility] aware of the above.    Completed discharge summary will need to be [facility]ed to [facility] at [phone] when final.    [facility] information and contact numbers have been given to patient's daughter, Jeremy Roach.    Above information shared with CMRN, Harvin HazelKelli,    Please call with questions.  Thank you,  Kristine GarbeWendy Slingerland, RN, BSN [facility] Hospital Liaison [phone]

## 2016-11-09 NOTE — Progress Notes (Signed)
Writer educated patient about needing a blood transfusion due to a hemoglobin level of 6.6. Patient refused to sign blood consent ; and stated he does not want foreign things going into his body.  Lawernce IonYari Damien Batty, RN  11/09/2016 8:27 AM

## 2016-11-09 NOTE — Progress Notes (Signed)
Dr. Clearence PedSchorr paged to notify critical lab result this AM of hgb 6.6., hct 19.6. Last Hgb result on 11/13 was 8.0. Awaiting response.

## 2016-11-11 LAB — TYPE AND SCREEN
ABO/RH(D): B POS
Antibody Screen: NEGATIVE
Unit division: 0
Unit division: 0

## 2016-12-09 ENCOUNTER — Other Ambulatory Visit (HOSPITAL_COMMUNITY): Payer: Self-pay | Admitting: Neurosurgery

## 2016-12-09 DIAGNOSIS — S065XAA Traumatic subdural hemorrhage with loss of consciousness status unknown, initial encounter: Secondary | ICD-10-CM

## 2016-12-09 DIAGNOSIS — S065X9A Traumatic subdural hemorrhage with loss of consciousness of unspecified duration, initial encounter: Secondary | ICD-10-CM

## 2017-01-18 ENCOUNTER — Ambulatory Visit (HOSPITAL_COMMUNITY)
Admission: RE | Admit: 2017-01-18 | Discharge: 2017-01-18 | Disposition: A | Discharge status: Home / Self Care | Payer: Medicare Other | Source: Ambulatory Visit | Attending: Neurosurgery | Admitting: Neurosurgery

## 2017-01-18 DIAGNOSIS — S065X9A Traumatic subdural hemorrhage with loss of consciousness of unspecified duration, initial encounter: Secondary | ICD-10-CM

## 2017-01-18 DIAGNOSIS — Z9889 Other specified postprocedural states: Secondary | ICD-10-CM | POA: Insufficient documentation

## 2017-01-18 DIAGNOSIS — I6200 Nontraumatic subdural hemorrhage, unspecified: Secondary | ICD-10-CM | POA: Insufficient documentation

## 2017-01-18 DIAGNOSIS — S065XAA Traumatic subdural hemorrhage with loss of consciousness status unknown, initial encounter: Secondary | ICD-10-CM

## 2017-07-12 IMAGING — US US RENAL
1 series · 14 of 25 positions shown · non-contrast
Comparison: CT 11/04/2016.

CLINICAL DATA: Patient with hydronephrosis. Follow-up evaluation
after catheter insertion.

EXAM:
RENAL / URINARY TRACT ULTRASOUND COMPLETE

[Series 1: us renal · 0.25mm/px · 14 of 34 slices shown]
[im 1/34]
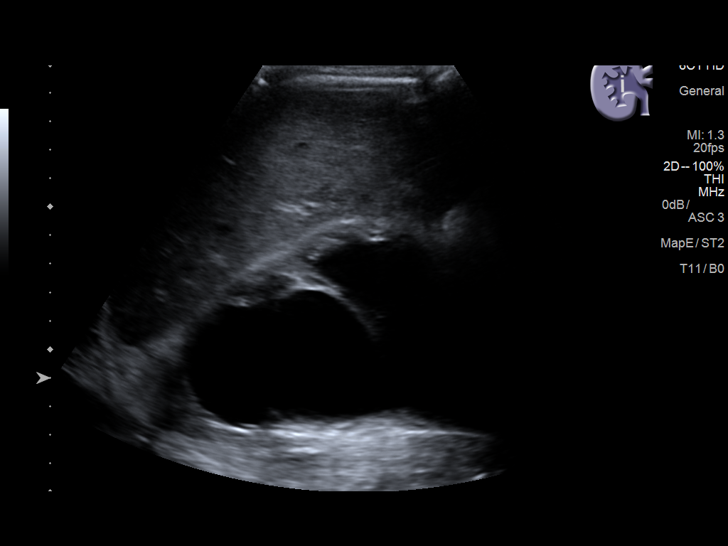
[im 3/34]
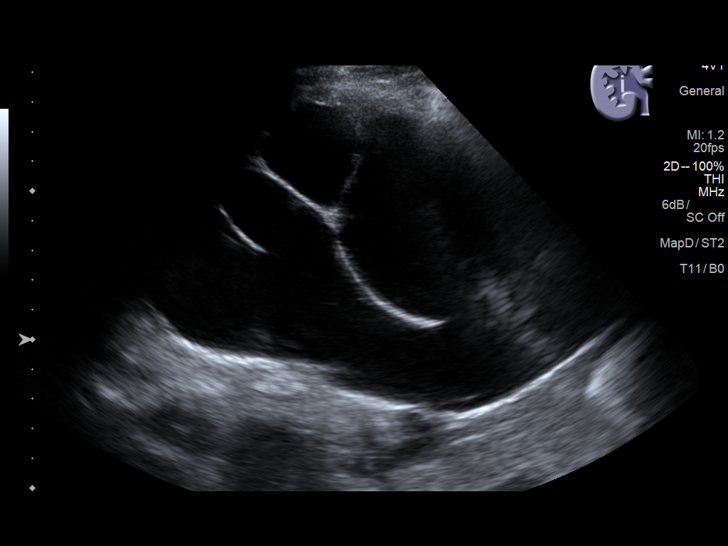
[im 6/34]
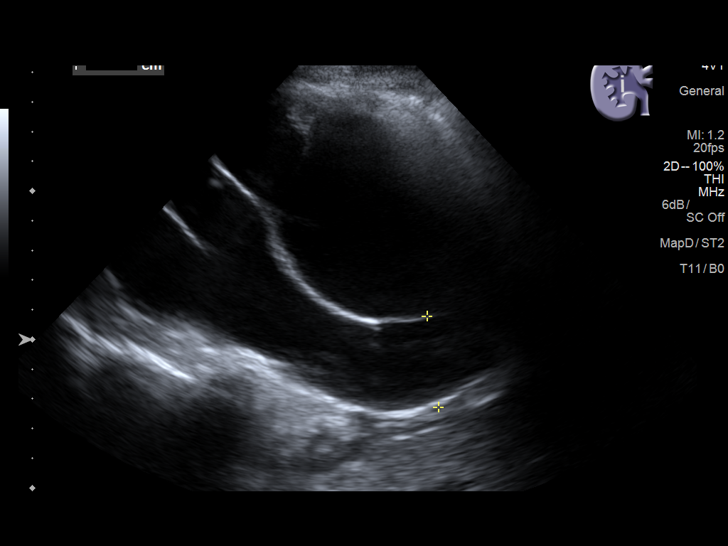
[im 9/34]
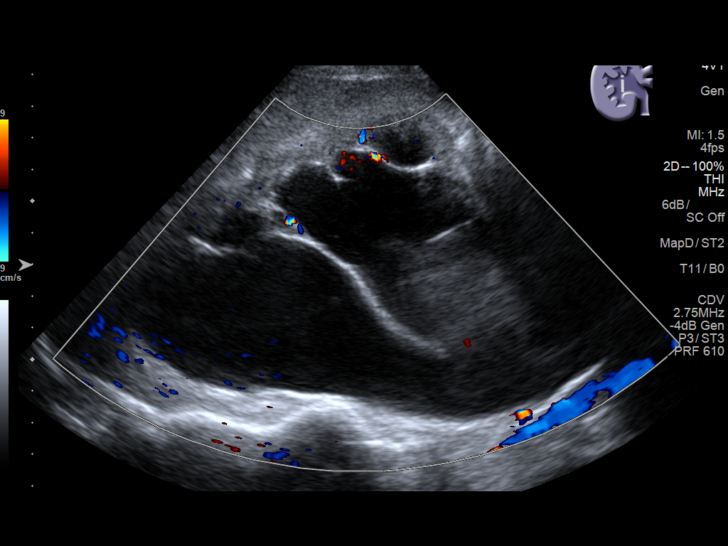
[im 12/34]
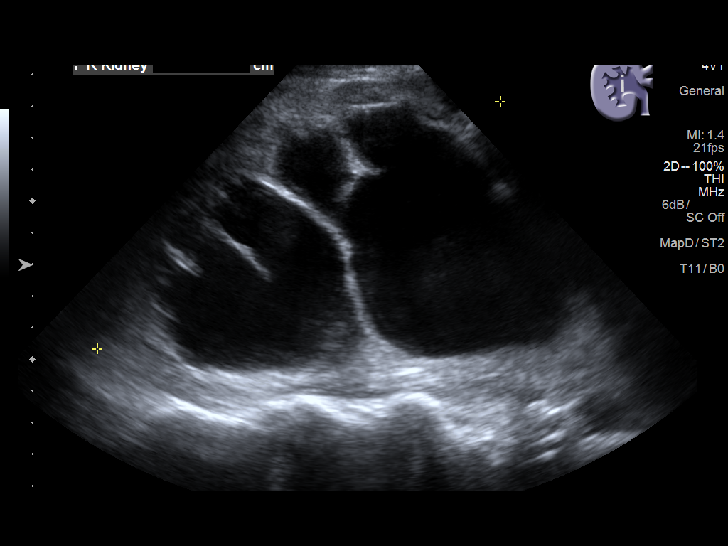
[im 13/34]
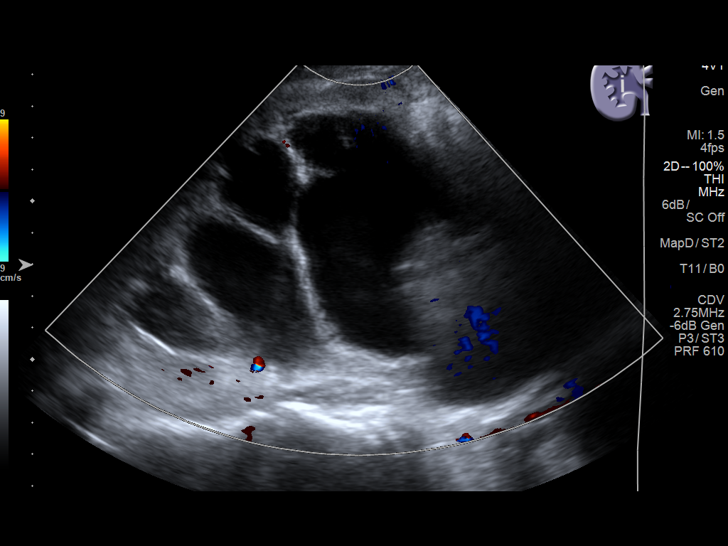
[im 16/34]
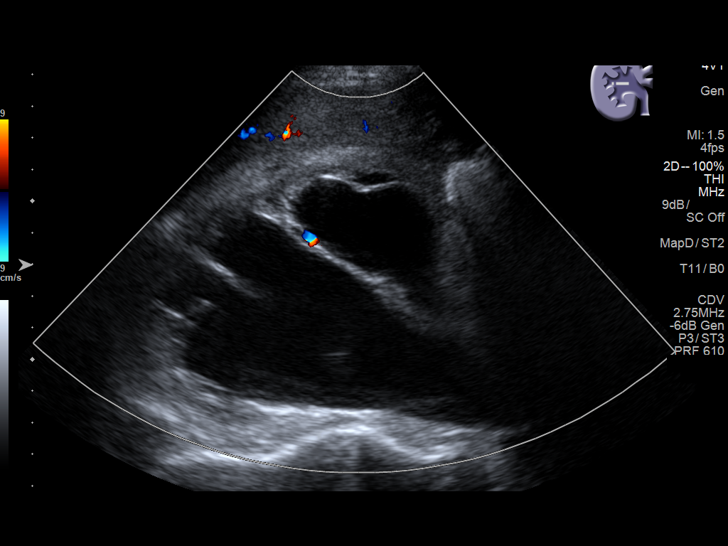
[im 18/34]
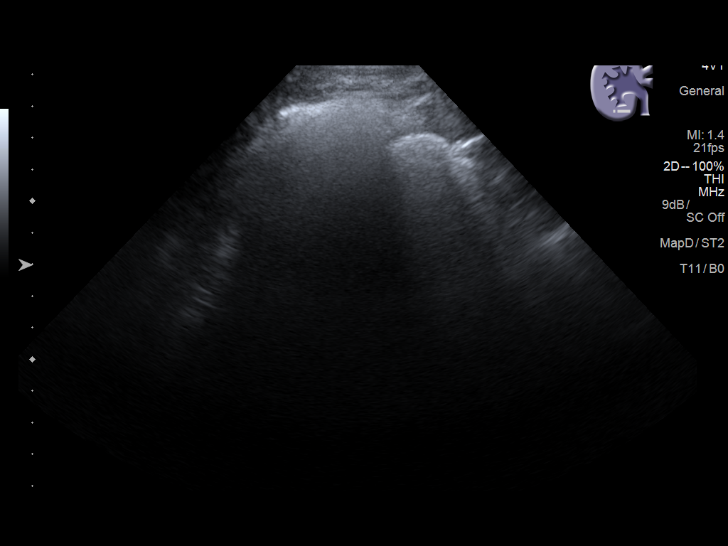
[im 21/34]
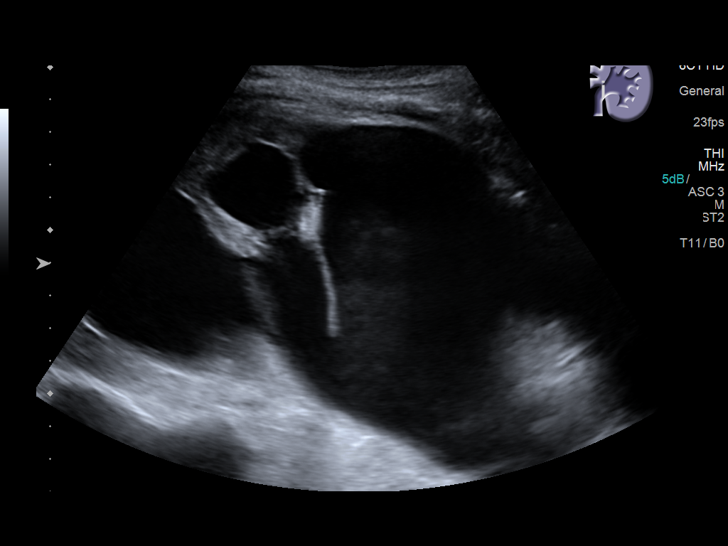
[im 23/34]
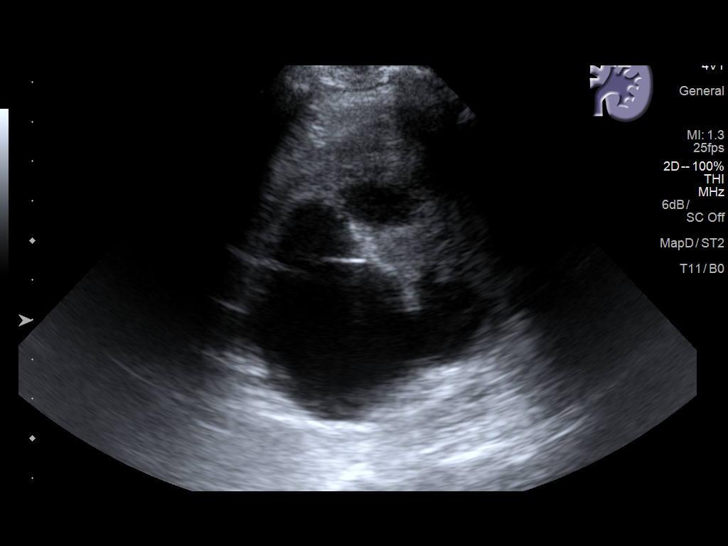
[im 25/34]
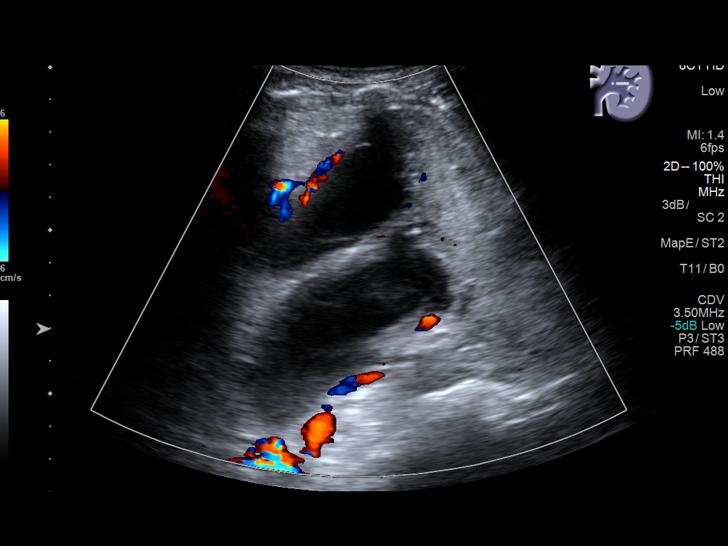
[im 28/34]
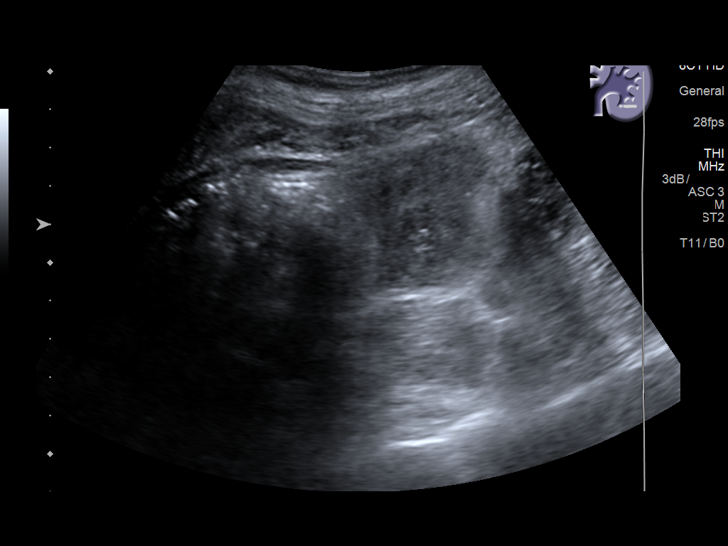
[im 31/34]
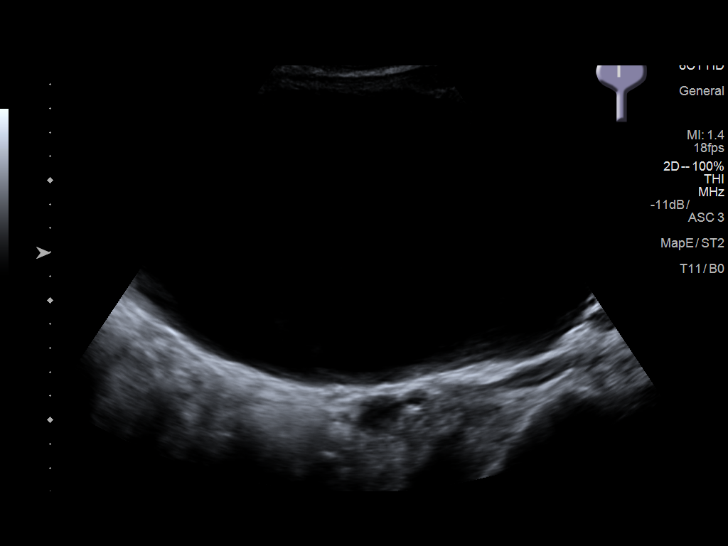
[im 34/34]
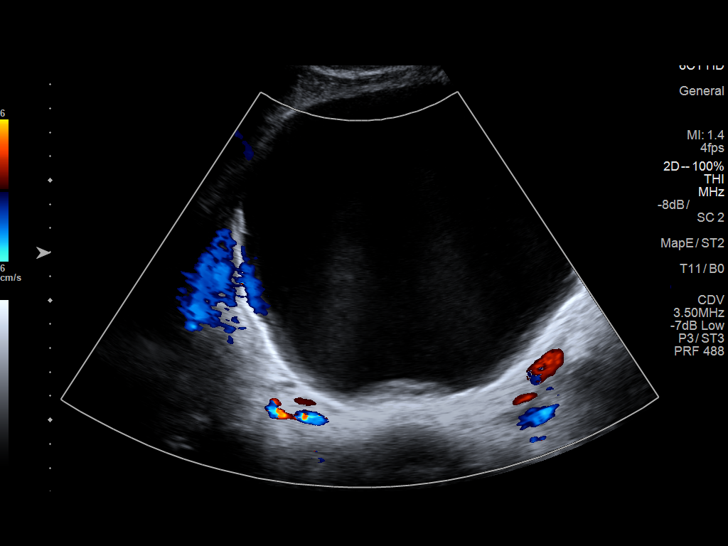

[14 of 25 positions shown; findings below may reference images not displayed]

FINDINGS: Right Kidney:

Length: 14.9 cm. Cortical thinning. Marked hydronephrosis and
dilatation of the proximal right ureter.

Left Kidney:

Length: 16.8 cm. Cortical thinning. Marked hydronephrosis and
dilatation of the proximal left ureter.

Bladder:

Foley catheter in place. Urinary bladder is still mildly distended.
Nonspecific calcification demonstrated within the bladder base.
IMPRESSION: Persistent severe bilateral hydronephrosis and associated cortical
thinning/atrophy of the kidneys bilaterally.

## 2017-09-13 ENCOUNTER — Other Ambulatory Visit: Payer: Self-pay | Admitting: Endocrinology

## 2018-04-21 IMAGING — CT CT ABD-PELV W/O CM
2 of 4 series · 11 of 46 positions shown, 12 images · non-contrast
Comparison: None.

CLINICAL DATA: Pain after fall. Abdominal mass. Altered mental
status.

EXAM:
CT ABDOMEN AND PELVIS WITHOUT CONTRAST
TECHNIQUE: Multidetector CT imaging of the abdomen and pelvis was performed
following the standard protocol without IV contrast.

[Series 201: routine, idose (2) · axial · 0.78mm/px · z∈[+22,+362]mm · 8 of 84 slices shown, 9 images]
[im 8/84  soft-tissue]
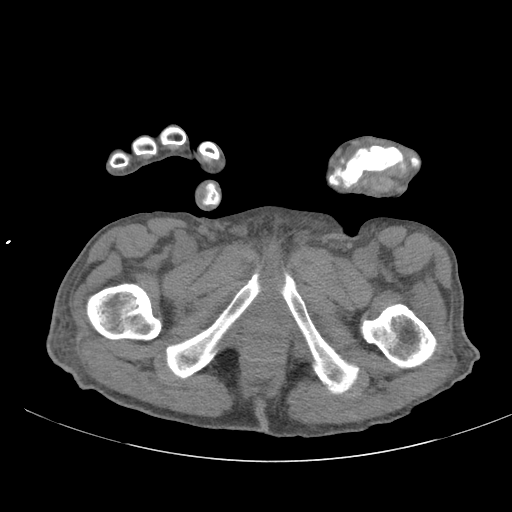
[im 8/84  bone]
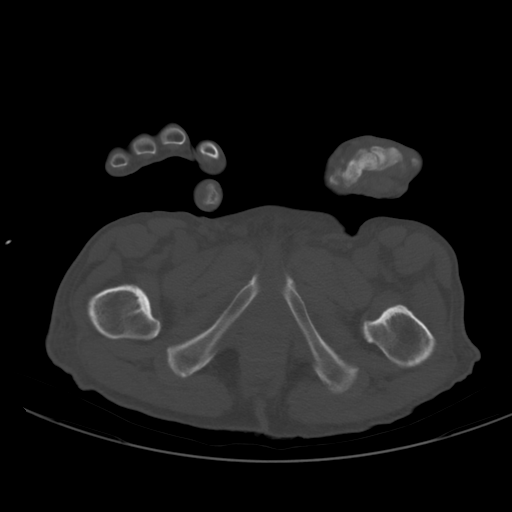
[im 19/84  soft-tissue]
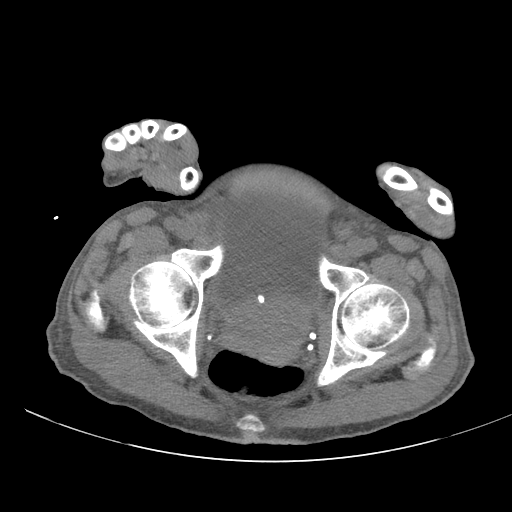
[im 26/84  soft-tissue]
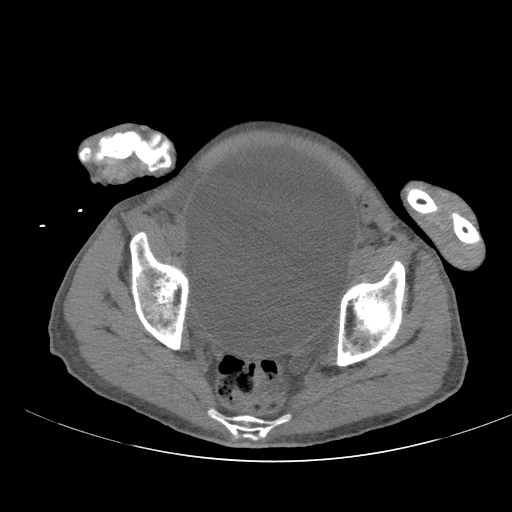
[im 37/84  soft-tissue]
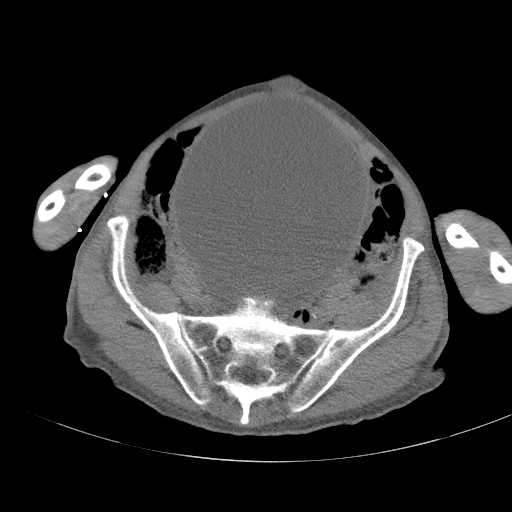
[im 47/84  soft-tissue]
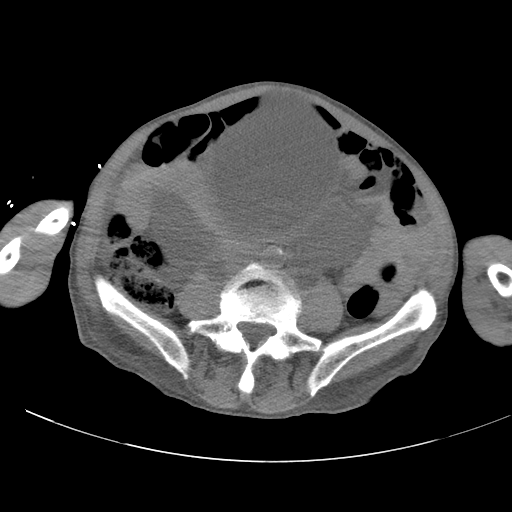
[im 58/84  soft-tissue]
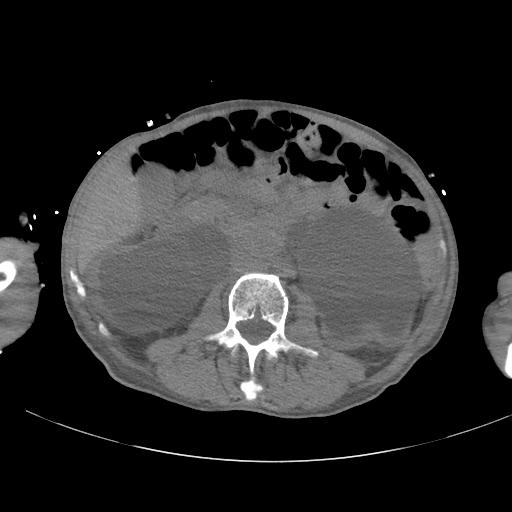
[im 65/84  soft-tissue]
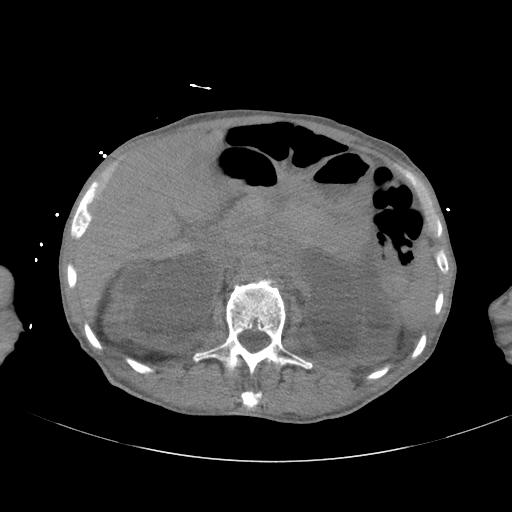
[im 76/84  soft-tissue]
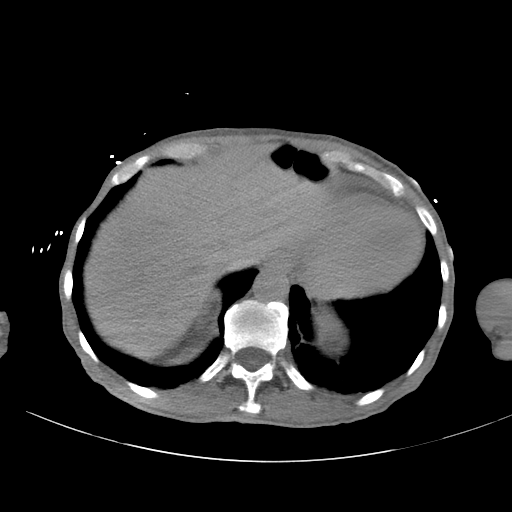

[Series 203: coronals, idose (2) · coronal · 0.45mm/px · 3 of 126 slices shown]
[im 42/126  soft-tissue]
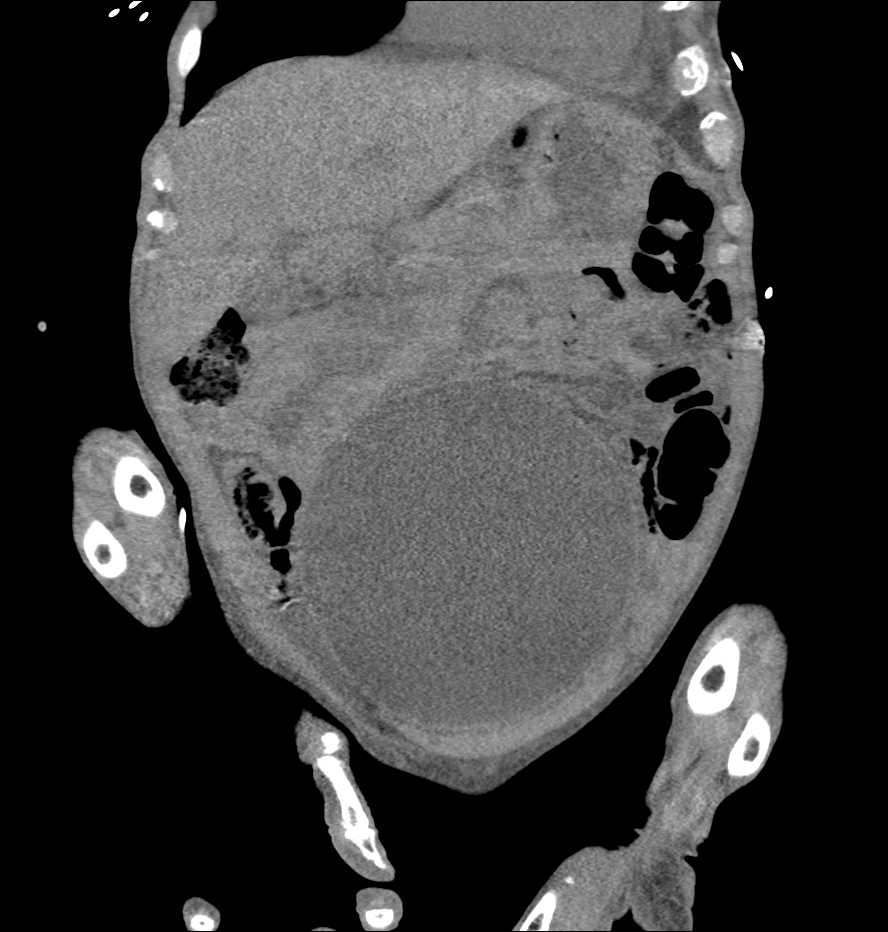
[im 56/126  soft-tissue]
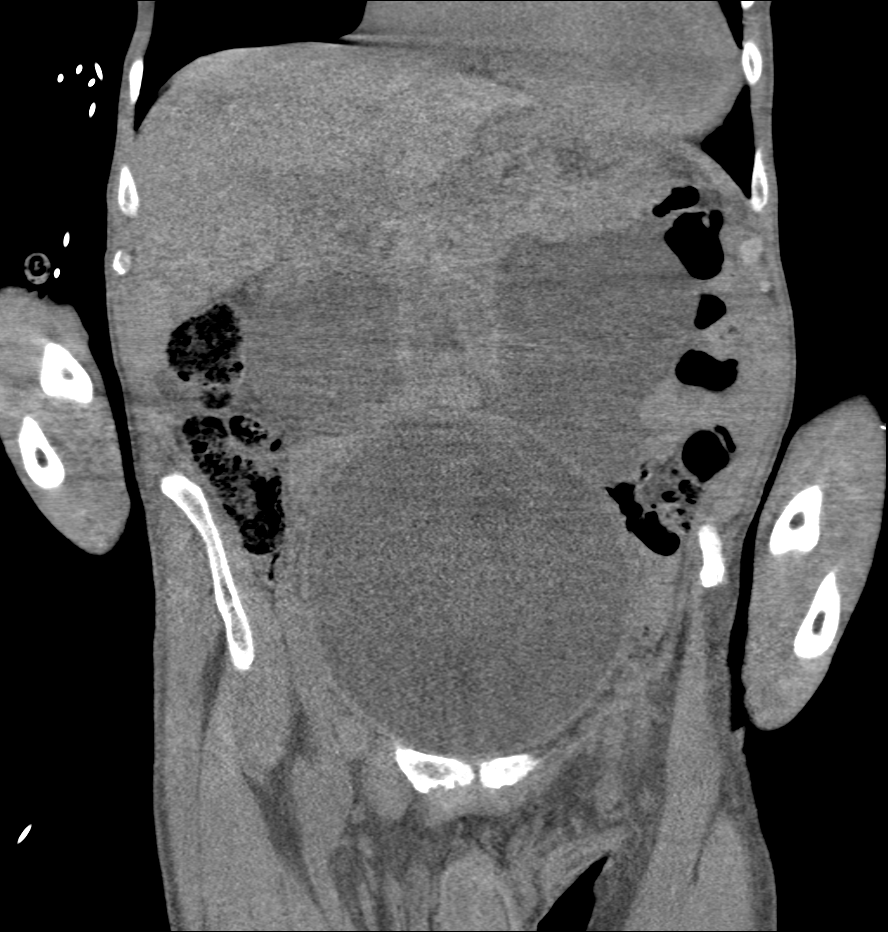
[im 70/126  soft-tissue]
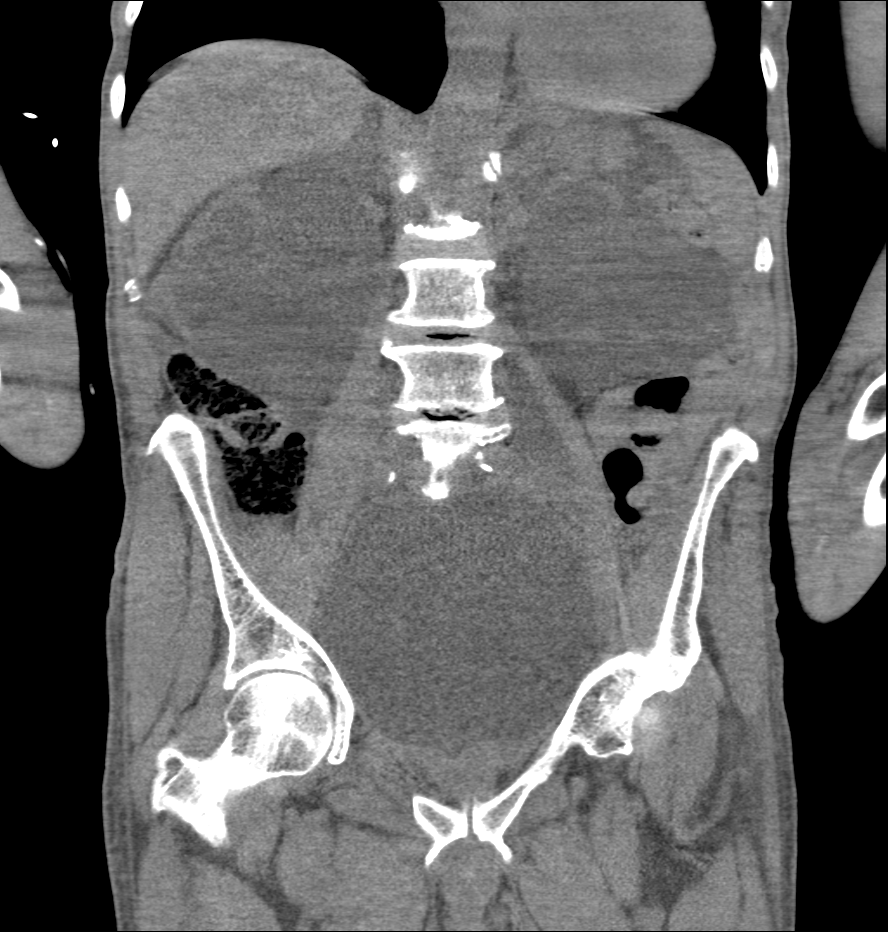

[11 of 46 positions shown; findings below may reference images not displayed]

FINDINGS: Lower chest: The heart is enlarged. Hyperdense appearance of the
cardiac walls and septum may be secondary to anemia. Faint
calcifications noted in the left ventricle, possibly related to the
mitral valve. No pericardial effusion. There is streaky atelectasis
and/or scarring at each lung base left greater than right. No
pneumonic consolidations. Tiny nodular density in the right lower
lobe laterally consistent with a branch point for pulmonary vessel.

Hepatobiliary: Nonspecific hypodensities in the left hepatic lobe
the largest is 15 mm seen on series 201, image 13 with Hounsfield
unit of 14. Findings are statistically consistent with cysts and/or
hemangiomata. The lack of IV contrast limits further assessment. No
biliary dilatation is noted. Gallbladder appears physiologically
distended without calculus.

Pancreas: The pancreas is unremarkable for this unenhanced study. No
ductal dilatation is apparent.

Spleen: There is no splenomegaly.

Adrenals/Urinary Tract: Marked bilateral hydroureteronephrosis with
marked distention of the bladder to 22.3 cm craniocaudad by 13 cm AP
by 15.2 cm transverse. There is a calcification along the floor of
the bladder measuring 12 by 8 by 5 mm possibly at the origin of the
prostatic urethra. This may be causing obstruction. Foley catheter
decompression is recommended. Neither adrenal gland is well
visualized due to lack of oral and IV contrast.

Stomach/Bowel: No bowel obstruction or definite inflammation.
Stomach is not distended. Moderate amount of stool in the right
colon and rectum.

Vascular/Lymphatic: Aortoiliac atherosclerosis without aneurysm. No
lymphadenopathy. Numerous pelvic phleboliths are seen bilaterally in
the lower pelvis.

Reproductive: Enlarged prostate with peripheral and central zone
calcifications measuring up to 7 cm.

Other: No bowel herniation. Trace fluid along the paracolic gutters.

Musculoskeletal: No acute osseous abnormality. There is degenerative
disc disease at L3-4, L4-5 and L5-S1 with associated
mild-to-moderate neural foraminal encroachment at L4-5.
IMPRESSION: Marked bilateral hydroureteronephrosis and bladder distention. A 12
x 8 x 5 mm bladder calcification is seen possibly at the origin of
the prostatic urethra which may explain the marked bladder (volume
of 4.4 liters) O and renal collecting system obstruction described.
Foley catheter to drainage is recommended.

## 2018-04-22 IMAGING — CR DG CHEST 1V PORT
1 series · 1 of 1 positions shown · non-contrast
Comparison: None.

CLINICAL DATA: Postop and craniotomy with subdural hematoma
evacuation.

EXAM:
PORTABLE CHEST 1 VIEW

[AP]
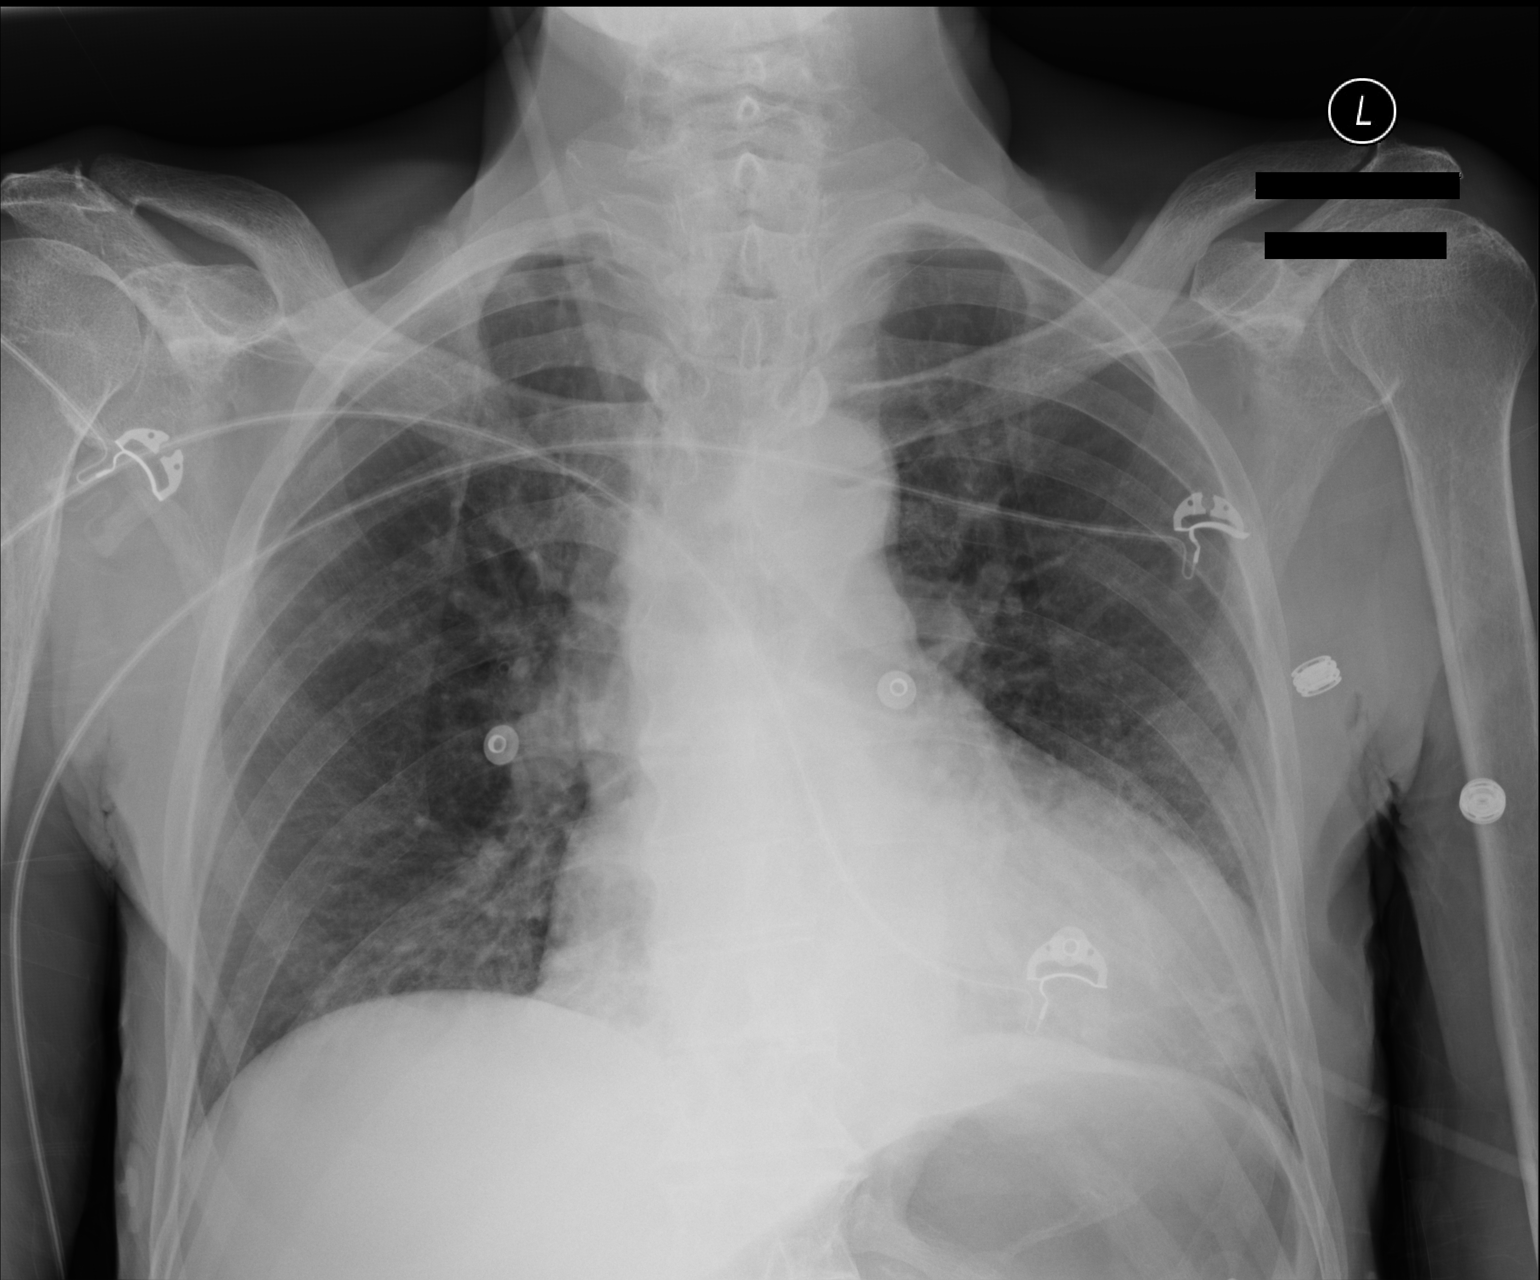

[1 of 1 positions shown; findings below may reference images not displayed]

FINDINGS: Cardiac enlargement without vascular congestion. No focal lung
consolidation or edema. No blunting of costophrenic angles. No
pneumothorax. Mediastinal contours appear intact. Degenerative
changes in the spine and shoulders.
IMPRESSION: Cardiac enlargement.  No evidence of active pulmonary disease.
